# Patient Record
Sex: Female | Born: 1970 | Race: White | Hispanic: No | Marital: Single | Smoking: Never smoker
Health system: Southern US, Community
[De-identification: ages and names within clinical notes are randomized; demographics above are authoritative.]

## PROBLEM LIST (undated history)

## (undated) DIAGNOSIS — F419 Anxiety disorder, unspecified: Secondary | ICD-10-CM

## (undated) DIAGNOSIS — J302 Other seasonal allergic rhinitis: Secondary | ICD-10-CM

## (undated) DIAGNOSIS — E669 Obesity, unspecified: Secondary | ICD-10-CM

## (undated) DIAGNOSIS — N736 Female pelvic peritoneal adhesions (postinfective): Secondary | ICD-10-CM

## (undated) DIAGNOSIS — E282 Polycystic ovarian syndrome: Secondary | ICD-10-CM

## (undated) DIAGNOSIS — T4145XA Adverse effect of unspecified anesthetic, initial encounter: Secondary | ICD-10-CM

## (undated) DIAGNOSIS — F339 Major depressive disorder, recurrent, unspecified: Secondary | ICD-10-CM

## (undated) HISTORY — DX: Major depressive disorder, recurrent, unspecified: F33.9

## (undated) HISTORY — PX: CHOLECYSTECTOMY: SHX55

## (undated) HISTORY — DX: Obesity, unspecified: E66.9

## (undated) HISTORY — DX: Other seasonal allergic rhinitis: J30.2

## (undated) HISTORY — DX: Anxiety disorder, unspecified: F41.9

## (undated) HISTORY — DX: Female pelvic peritoneal adhesions (postinfective): N73.6

---

## 1989-11-26 DIAGNOSIS — T8859XA Other complications of anesthesia, initial encounter: Secondary | ICD-10-CM

## 1989-11-26 HISTORY — DX: Other complications of anesthesia, initial encounter: T88.59XA

## 1999-11-17 ENCOUNTER — Other Ambulatory Visit: Admission: RE | Admit: 1999-11-17 | Discharge: 1999-11-17 | Payer: Self-pay | Admitting: Obstetrics and Gynecology

## 2000-01-02 ENCOUNTER — Other Ambulatory Visit: Admission: RE | Admit: 2000-01-02 | Discharge: 2000-01-02 | Payer: Self-pay | Admitting: Obstetrics and Gynecology

## 2000-02-07 ENCOUNTER — Encounter (INDEPENDENT_AMBULATORY_CARE_PROVIDER_SITE_OTHER): Payer: Self-pay

## 2000-02-07 ENCOUNTER — Other Ambulatory Visit: Admission: RE | Admit: 2000-02-07 | Discharge: 2000-02-07 | Payer: Self-pay | Admitting: Obstetrics and Gynecology

## 2000-12-05 ENCOUNTER — Other Ambulatory Visit: Admission: RE | Admit: 2000-12-05 | Discharge: 2000-12-05 | Payer: Self-pay | Admitting: Obstetrics and Gynecology

## 2001-12-17 ENCOUNTER — Other Ambulatory Visit: Admission: RE | Admit: 2001-12-17 | Discharge: 2001-12-17 | Payer: Self-pay | Admitting: Obstetrics and Gynecology

## 2003-12-31 ENCOUNTER — Other Ambulatory Visit: Admission: RE | Admit: 2003-12-31 | Discharge: 2003-12-31 | Payer: Self-pay | Admitting: Obstetrics and Gynecology

## 2004-05-26 ENCOUNTER — Ambulatory Visit (HOSPITAL_BASED_OUTPATIENT_CLINIC_OR_DEPARTMENT_OTHER): Admission: RE | Admit: 2004-05-26 | Discharge: 2004-05-26 | Payer: Self-pay | Admitting: Obstetrics and Gynecology

## 2005-04-26 ENCOUNTER — Ambulatory Visit (HOSPITAL_COMMUNITY): Admission: RE | Admit: 2005-04-26 | Discharge: 2005-04-26 | Payer: Self-pay | Admitting: Surgery

## 2005-04-28 ENCOUNTER — Ambulatory Visit (HOSPITAL_COMMUNITY): Admission: RE | Admit: 2005-04-28 | Discharge: 2005-04-28 | Payer: Self-pay | Admitting: Obstetrics and Gynecology

## 2006-01-15 ENCOUNTER — Encounter: Admission: RE | Admit: 2006-01-15 | Discharge: 2006-04-15 | Payer: Self-pay | Admitting: Gynecology

## 2006-02-04 ENCOUNTER — Other Ambulatory Visit: Admission: RE | Admit: 2006-02-04 | Discharge: 2006-02-04 | Payer: Self-pay | Admitting: Gynecology

## 2006-05-28 ENCOUNTER — Encounter: Admission: RE | Admit: 2006-05-28 | Discharge: 2006-05-28 | Payer: Self-pay | Admitting: Gynecology

## 2006-08-09 ENCOUNTER — Ambulatory Visit (HOSPITAL_COMMUNITY): Admission: RE | Admit: 2006-08-09 | Discharge: 2006-08-09 | Payer: Self-pay | Admitting: Gynecology

## 2006-08-20 ENCOUNTER — Inpatient Hospital Stay (HOSPITAL_COMMUNITY): Admission: RE | Admit: 2006-08-20 | Discharge: 2006-08-23 | Payer: Self-pay | Admitting: Gynecology

## 2006-08-20 ENCOUNTER — Encounter (INDEPENDENT_AMBULATORY_CARE_PROVIDER_SITE_OTHER): Payer: Self-pay | Admitting: Specialist

## 2006-10-04 ENCOUNTER — Other Ambulatory Visit: Admission: RE | Admit: 2006-10-04 | Discharge: 2006-10-04 | Payer: Self-pay | Admitting: Gynecology

## 2007-10-06 ENCOUNTER — Other Ambulatory Visit: Admission: RE | Admit: 2007-10-06 | Discharge: 2007-10-06 | Payer: Self-pay | Admitting: Obstetrics and Gynecology

## 2010-12-17 ENCOUNTER — Encounter: Payer: Self-pay | Admitting: Obstetrics and Gynecology

## 2011-04-13 NOTE — H&P (Signed)
NAMEBurnadette Hoffman, Christina Hoffman                   ACCOUNT NO.:  [id number]   MEDICAL RECORD NO.:  [id number]          PATIENT TYPE:  INP   LOCATION:  9147                          FACILITY:  [facility]   PHYSICIAN:  Timothy P. 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   HISTORY OF PRESENT ILLNESS:  A 40 year old, G1 P0, female, EDC 08/29/2006,  admitted for cesarean section due to breech presentation.  Pregnancy has  been complicated by gestational diabetes followed with diet-controlled  antepartum testing which has been normal.  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  For the remainder of her history, see her [facility].   PHYSICAL EXAMINATION:  HEENT:  Normal.  LUNGS:  Clear.  CARDIAC:  Regular rate.  No rubs, murmurs or gallops.  ABDOMINAL EXAM:  Gravid breech presentation, positive fetal heart tones.  PELVIC:  Deferred.   ASSESSMENT AND PLAN:  A 40 year old, G1 P0 female, term gestation, history  of breech presentation, gestational diabetes, positive beta streptococcus  carrier, for primary cesarean section.  Options for attempted version,  vaginal delivery breech versus primary cesarean section were all reviewed,  and the patient elects for a primary cesarean section.  I reviewed with what  is involved with the procedure, expected intraoperative and postoperative  course, short-term and long-term risks to include the risk of infection,  prolonged antibiotics, wound complications requiring opening and draining of  incisions, closure by secondary intention, abscess formation with  reoperation, abscess drainage, hemorrhage necessitating transfusion and the  risks of transfusion, inadvertent injury to internal organs including bowel,  bladder, ureters, vessels and nerves  necessitating major exploratory  reparative surgeries and future reparative surgeries including ostomy  formation, the risk of fetal injury during the birthing process to include  musculoskeletal, neural and skeletal injuries were all reviewed, understood  and accepted.  The patient's questions were answered to her satisfaction,  and she is ready to proceed with surgery.      Timothy P. Fontaine, M.D.  Electronically Signed     TPF/MEDQ  D:  08/20/2006  T:  08/20/2006  Job:  [id number]

## 2011-04-13 NOTE — Op Note (Signed)
NAMEBurnadette Hoffman, Christina Hoffman                   ACCOUNT NO.:  [id number]   MEDICAL RECORD NO.:  [id number]          PATIENT TYPE:  INP   LOCATION:  9147                          FACILITY:  [facility]   PHYSICIAN:  Timothy P. 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   PROCEDURE:  Primary low transverse cervical cesarean section.   SURGEON:  Timothy P. Fontaine, M.D.   ASSISTANT:  Ivor Costa. Farrel Gobble, M.D.   ANESTHETIC:  Spinal.   ESTIMATED BLOOD LOSS:  Less than 500 mL.   COMPLICATIONS:  None.   SPECIMEN:  1. Placenta.  2. Cord blood  3. Cord blood banking, private.   FINDINGS:  At 1339 normal female, Apgars 9 and 9, weight 6 pounds 13 ounces.  Pelvic anatomy noted to be normal.  Infant in the frank breech presentation.   PROCEDURE:  The patient was taken to the operating room, underwent spinal  anesthesia, received an abdominal preparation with Betadine solution.  Foley  catheter placed in sterile technique per nursing personnel.  The patient was  draped in the usual fashion.  After assuring adequate anesthesia, the  abdomen sharply entered through a Pfannenstiel's incision achieving adequate  hemostasis at all levels.  The bladder flap was sharply bluntly developed  without difficulty and the uterus was sharply entered in the lower uterine  segment and bluntly extended laterally.  The membranes were ruptured.  The  fluid noted to be clear.  The infant found in the frank breech presentation  was converted to a footling breech and underwent a breech extraction without  difficulty.  The nares and mouth were suctioned.  The cord doubly clamped  and cut and the  infant was handed to pediatrics in attendance.  Samples of  cord blood were obtained.  Subsequently, private cord blood banking was  obtained and the specimen was handed to the circulating nurse for  processing.  The placenta was then spontaneously extruded and noted to be  intact and was sent to pathology.  The patient received 1 gram of antibiotic  prophylaxis at this time.  The uterine cavity was explored with a sponge to  remove all placental and membrane fragments.  The uterine incision was then  closed in two layers using zero Vicryl suture first in a running  interlocking stitch followed by imbricating stitch.  The adnexa were then  examined with grossly normal-appearing fallopian tubes and ovaries  bilaterally.  The pelvis was copiously irrigated.  Adequate hemostasis  visualized.  The anterior fascia reapproximated using zero Vicryl suture in  a running stitch starting at the angle, meeting in the middle.  Subcutaneous  tissues were irrigated.  Adequate hemostasis achieved with electrocautery.  Skin reapproximated using 4-0 Vicryl in a running subcuticular stitch.  Steri-Strips and Benzoin applied.  Sterile dressing applied.  The patient  was taken to the recovery room in good condition having tolerated the  procedure well.      Timothy P. Fontaine, M.D.  Electronically Signed     TPF/MEDQ  D:  08/20/2006  T:  08/22/2006  Job:  [id number]

## 2011-04-13 NOTE — Discharge Summary (Signed)
NAMEBurnadette Hoffman, [name]                   ACCOUNT NO.:  [id number]   MEDICAL RECORD NO.:  [id number]          PATIENT TYPE:  INP   LOCATION:  9147                          FACILITY:  [facility]   PHYSICIAN:  Timothy P. Fontaine, M.D.DATE OF BIRTH:  1971/03/09   DATE OF ADMISSION:  08/20/2006  DATE OF DISCHARGE:  08/23/2006                                 DISCHARGE SUMMARY   DISCHARGE DIAGNOSES:  1. Term pregnancy.  2. Gestational diabetes.  3. Positive beta strep carrier.  4. Breech presentation.   POSTOPERATIVE DIAGNOSES:  1. Term pregnancy.  2. Gestational diabetes.  3. Positive beta strep carrier.  4. Breech presentation.   PROCEDURE:  Primary low transverse cervical cesarean section, August 20, 2006, Dr. Colin Broach.  Pathology pending.   HOSPITAL COURSE:  A 40 year old, G1, P0, female, term gestation, who  underwent primary low transverse cervical cesarean section on August 20, 2006 for breech presentation.  The patient delivered a normal female infant,  Apgars 9/9, weight 6 pounds, 13 ounces, in the frank breech presentation.  The patient's postoperative course was uncomplicated, and she was discharged  on postoperative day #3, ambulating well, following a regular diet, with a  postoperative hemoglobin of 10.9.  Blood type O positive, rubella titer  positive.  The patient received precautions, instructions and followup.   FOLLOWUP:  Will be seen in the office 6 weeks following discharge.   MEDICATIONS:  Was given a prescription for pain medication per Dr. Douglass Rivers.      Timothy P. Fontaine, M.D.  Electronically Signed     TPF/MEDQ  D:  08/23/2006  T:  08/24/2006  Job:  [id number]

## 2011-04-13 NOTE — Op Note (Signed)
NAME:  [facility], [place]                               ACCOUNT NO.:  [id number]   MEDICAL RECORD NO.:  [id number]                   PATIENT TYPE:  AMB   LOCATION:  [facility]                                 FACILITY:  [facility]   PHYSICIAN:  Daniel L. Eda Paschal, M.D.           DATE OF BIRTH:  14-Jun-1971   DATE OF PROCEDURE:  05/26/2004  DATE OF DISCHARGE:                                 OPERATIVE REPORT   PREOPERATIVE DIAGNOSES:  1. Polycystic ovarian syndrome.  2. Endometriosis suspected.   POSTOPERATIVE DIAGNOSES:  1. Polycystic ovarian syndrome.  2. Enlarged uterus, probably adenomyosis.   OPERATION/PROCEDURE:  Diagnostic laparoscopy.   SURGEON:  Daniel L. Eda Paschal, M.D.   ANESTHESIA:  General.   DESCRIPTION OF PROCEDURE:  After adequate general anesthesia, the patient  was placed in the dorsal lithotomy position and prepped and draped in the  usual sterile manner.  Her bladder was emptied with the [facility] catheter.  A Jarcho cannula was placed in the uterus.  A small incision was made  subumbilically and then through this right Veress needle was introduced.  Because of the patient's large size, a long Veress needle was utilized.  When it went in, it felt as if there had been the normal pop through both  fascia and peritoneum.  When fluid was put in, it appeared to go down  without coming back and it appeared to be well placed.  Initially about 1 L  of CO2 could be introduced, but at that point, no more could be introduced.  Attempts were made to replace the needle but we could not longer get any  flow.  It was felt at this point we should abandon a closed laparoscopy  technique and go to an open.   The subumbilical incision was extended.  After much difficulty, the fascia  was identified.  Because the patient was so obese, even at this point it was  really exceedingly difficult to open the peritoneum, and, therefore, at this  point under direct vision, a Veress needle was introduced  through the  peritoneum.  Once again when saline was placed, it disappeared without any  evidence of returning.  But once again flow was as minimal.  Elevating this  area and being exceedingly gentle, a 10 mm trocar was placed.  We had to use  a long one because even though we could see all the way to the peritoneum,  the depth of her fascia and preperitoneal area was significant and this was  gently introduced until we felt a pop into the peritoneal cavity.  The  diagnostic laparoscope was introduced.  We were clearing the peritoneal  cavity and there was no evidence of injury to any structures, but even now,  the fascial sutures had been placed in place to prevent loss of  pneumoperitoneum, we could never develop a significant pneumoperitoneum.  However, we could see well enough to do  a diagnostic laparoscopy.  We could  see anteriorly below the subumbilical incision and a 5 mm port could be  successfully placed there without any injury. Through that a probe was  placed.  By elevating the uterus, putting tension on the uterus, with the  probe we were able to look around. The patient had normal fallopian tubes.  The patient had very large polycystic ovaries with clean surfaces and no  evidence of any neoplasm.  Cul-de-sac was free of any disease.  The uterus  itself, however, was very hard to move, was very boggy and enlarged.  No  specific myoma could be identified and the surgeon was wondering if she did  not have adenomyosis.  This will be compared to preoperative ultrasound that  were done during previous ovulation and duction cycles.   At the termination of the procedure, the trocars were removed.  The fascia  could be reapproximated under direct vision with interrupted 0 Vicryl and  the skin was closed with a 3-0 Monocryl subumbilically and then Dermabond  was placed on the lower 5 mm incision.  The patient tolerated the procedure  well and left the operating room in satisfactory  condition.                                               Daniel L. Eda Paschal, M.D.    Tonette Bihari  D:  05/26/2004  T:  05/26/2004  Job:  [id number]

## 2013-08-27 ENCOUNTER — Emergency Department (HOSPITAL_COMMUNITY): Payer: Medicaid Other

## 2013-08-27 ENCOUNTER — Emergency Department (HOSPITAL_COMMUNITY)
Admission: EM | Admit: 2013-08-27 | Discharge: 2013-08-27 | Disposition: A | Discharge status: Home / Self Care | Payer: Medicaid Other | Attending: Emergency Medicine | Admitting: Emergency Medicine

## 2013-08-27 ENCOUNTER — Encounter (HOSPITAL_COMMUNITY): Payer: Self-pay

## 2013-08-27 DIAGNOSIS — E282 Polycystic ovarian syndrome: Secondary | ICD-10-CM | POA: Insufficient documentation

## 2013-08-27 DIAGNOSIS — Z79899 Other long term (current) drug therapy: Secondary | ICD-10-CM | POA: Insufficient documentation

## 2013-08-27 DIAGNOSIS — R630 Anorexia: Secondary | ICD-10-CM | POA: Insufficient documentation

## 2013-08-27 DIAGNOSIS — R102 Pelvic and perineal pain: Secondary | ICD-10-CM

## 2013-08-27 DIAGNOSIS — N949 Unspecified condition associated with female genital organs and menstrual cycle: Secondary | ICD-10-CM | POA: Insufficient documentation

## 2013-08-27 HISTORY — DX: Polycystic ovarian syndrome: E28.2

## 2013-08-27 LAB — CBC WITH DIFFERENTIAL/PLATELET
Eosinophils Relative: 1 % (ref 0–5)
HCT: 40.1 % (ref 36.0–46.0)
Hemoglobin: 13.8 g/dL (ref 12.0–15.0)
Lymphocytes Relative: 28 % (ref 12–46)
MCH: 30.5 pg (ref 26.0–34.0)
MCV: 88.5 fL (ref 78.0–100.0)
Monocytes Absolute: 0.7 10*3/uL (ref 0.1–1.0)
Neutrophils Relative %: 67 % (ref 43–77)
Platelets: 279 10*3/uL (ref 150–400)
RBC: 4.53 MIL/uL (ref 3.87–5.11)
RDW: 13.5 % (ref 11.5–15.5)
WBC: 15.1 10*3/uL — ABNORMAL HIGH (ref 4.0–10.5)

## 2013-08-27 LAB — BASIC METABOLIC PANEL
BUN: 10 mg/dL (ref 6–23)
Calcium: 9.1 mg/dL (ref 8.4–10.5)
GFR calc Af Amer: >90 mL/min (ref >90)
GFR calc non Af Amer: >90 mL/min (ref >90)
Glucose, Bld: 94 mg/dL (ref 70–99)
Sodium: 140 mEq/L (ref 135–145)

## 2013-08-27 LAB — URINALYSIS, ROUTINE W REFLEX MICROSCOPIC
Bilirubin Urine: NEGATIVE
Glucose, UA: NEGATIVE mg/dL
Ketones, ur: 15 mg/dL — AB
Urobilinogen, UA: 0.2 mg/dL (ref 0.0–1.0)

## 2013-08-27 MED ORDER — IOHEXOL 300 MG/ML  SOLN
25.0000 mL | INTRAMUSCULAR | Status: AC
  Administered 2013-08-27: 25 mL via ORAL

## 2013-08-27 MED ORDER — TRAMADOL HCL 50 MG PO TABS: 50.0000 mg | ORAL_TABLET | Freq: Four times a day (QID) | ORAL | Status: DC | PRN

## 2013-08-27 MED ORDER — IOHEXOL 300 MG/ML  SOLN
100.0000 mL | Freq: Once | INTRAMUSCULAR | Status: AC | PRN
  Administered 2013-08-27: 100 mL via INTRAVENOUS

## 2013-08-27 MED ORDER — NAPROXEN 500 MG PO TABS: 500.0000 mg | ORAL_TABLET | Freq: Two times a day (BID) | ORAL | Status: DC

## 2013-08-27 MED ORDER — KETOROLAC TROMETHAMINE 30 MG/ML IJ SOLN
30.0000 mg | Freq: Once | INTRAMUSCULAR | Status: AC
  Administered 2013-08-27: 30 mg via INTRAVENOUS
  Filled 2013-08-27: qty 1

## 2013-08-27 NOTE — ED Notes (Signed)
Patient transported to CT 

## 2013-08-27 NOTE — ED Notes (Signed)
Pt made aware of delay for CT.  No other concerns or questions at this time

## 2013-08-27 NOTE — ED Notes (Signed)
Presents with RLQ abdominal pain that began one year ago, became worse last night after eating large meal. Pain associated with nausea and is constant today. Denies vaginal bleeding or discharge.

## 2013-08-27 NOTE — ED Notes (Signed)
Patient is resting comfortably.  Patient given [place] sandwich and coke.  Requested Tylenol for headache.  Tuskegee Cellar RN notified.

## 2013-08-27 NOTE — ED Notes (Signed)
Dr. James at bedside  

## 2013-08-28 LAB — URINE CULTURE
Colony Count: NO GROWTH
Culture: NO GROWTH

## 2013-09-03 NOTE — ED Provider Notes (Signed)
CSN: [id number]     Arrival date & time 08/27/13  1608 History   First MD Initiated Contact with Patient 08/27/13 1618     Chief Complaint  Patient presents with  . Abdominal Pain   ) HPI  Patient has had abdominal pain for over a years time. Has been intermittent but primarily in her right lower quadrant. Most times it starts about 5 or 6 days "before.". She is about a week premenstrual now. No bleeding between cycles. No dyspareunia. No vaginal bleeding discharge or itching no urinary burning or frequency. Globally poor appetite but no nausea vomiting or diarrhea.  Past Medical History  Diagnosis Date  . Polycystic ovarian syndrome    Past Surgical History  Procedure Laterality Date  . Cesarean section    . Cholecystectomy  23 years ago    had bile in abdomen after surgery   No family history on file. History  Substance Use Topics  . Smoking status: Never Smoker   . Smokeless tobacco: Not on file  . Alcohol Use: No   OB History   Grav Para Term Preterm Abortions TAB SAB Ect Mult Living                 Review of Systems  Constitutional: Negative for fever, chills, diaphoresis, appetite change and fatigue.  HENT: Negative for mouth sores, sore throat and trouble swallowing.   Eyes: Negative for visual disturbance.  Respiratory: Negative for cough, chest tightness, shortness of breath and wheezing.   Cardiovascular: Negative for chest pain.  Gastrointestinal: Positive for abdominal pain. Negative for nausea, vomiting, diarrhea and abdominal distention.  Endocrine: Negative for polydipsia, polyphagia and polyuria.  Genitourinary: Positive for pelvic pain. Negative for dysuria, frequency and hematuria.  Musculoskeletal: Negative for gait problem.  Skin: Negative for color change, pallor and rash.  Neurological: Negative for dizziness, syncope, light-headedness and headaches.  Hematological: Does not bruise/bleed easily.  Psychiatric/Behavioral: Negative for behavioral  problems and confusion.    Allergies  Review of patient's allergies indicates no known allergies.  Home Medications   Current Outpatient Rx  Name  Route  Sig  Dispense  Refill  . ibuprofen (ADVIL,MOTRIN) 200 MG tablet   Oral   Take 400 mg by mouth every 6 (six) hours as needed for pain.         Marland Kitchen metformin (FORTAMET) 500 MG (OSM) 24 hr tablet   Oral   Take 250 mg by mouth daily.         . naproxen (NAPROSYN) 375 MG tablet   Oral   Take 375 mg by mouth 2 (two) times daily as needed (pain).         . Norgestimate-Ethinyl Estradiol Triphasic (ORTHO TRI-CYCLEN LO) 0.18/0.215/0.25 MG-25 MCG tab   Oral   Take 1 tablet by mouth daily.         . traMADol (ULTRAM) 50 MG tablet   Oral   Take 50 mg by mouth every 6 (six) hours as needed for pain.         . naproxen (NAPROSYN) 500 MG tablet   Oral   Take 1 tablet (500 mg total) by mouth 2 (two) times daily.   30 tablet   0   . traMADol (ULTRAM) 50 MG tablet   Oral   Take 1 tablet (50 mg total) by mouth every 6 (six) hours as needed for pain.   15 tablet   0    BP 133/84  Pulse 88  Temp(Src) 98.2 F (  36.8 C) (Oral)  Resp 18  Ht 5\' 3"  (1.6 m)  Wt 209 lb (94.802 kg)  BMI 37.03 kg/m2  SpO2 99%  LMP 07/27/2013 Physical Exam  Constitutional: She is oriented to person, place, and time. She appears well-developed and well-nourished. No distress.  HENT:  Head: Normocephalic.  Eyes: Conjunctivae are normal. Pupils are equal, round, and reactive to light. No scleral icterus.  Neck: Normal range of motion. Neck supple. No thyromegaly present.  Cardiovascular: Normal rate and regular rhythm.  Exam reveals no gallop and no friction rub.   No murmur heard. Pulmonary/Chest: Effort normal and breath sounds normal. No respiratory distress. She has no wheezes. She has no rales.  Abdominal: Soft. Bowel sounds are normal. She exhibits no distension. There is no tenderness. There is no rebound.  Tenderness poorly localized to  the right lower quadrant. No guarding rebound or peritoneal irritation. Some suprapubic discomfort as well again no guarding rebound or peritoneal irritation  Musculoskeletal: Normal range of motion.  Neurological: She is alert and oriented to person, place, and time.  Skin: Skin is warm and dry. No rash noted.  Psychiatric: She has a normal mood and affect. Her behavior is normal.    ED Course  Procedures (including critical care time) Labs Review Labs Reviewed  CBC WITH DIFFERENTIAL - Abnormal; Notable for the following:    WBC 15.1 (*)    Neutro Abs 10.1 (*)    Lymphs Abs 4.2 (*)    All other components within normal limits  URINALYSIS, ROUTINE W REFLEX MICROSCOPIC - Abnormal; Notable for the following:    APPearance HAZY (*)    Ketones, ur 15 (*)    All other components within normal limits  URINE CULTURE  BASIC METABOLIC PANEL   Imaging Review No results found.  MDM   1. Pelvic pain    CT and pelvic ultrasound showed no acute findings. Discussed with her this may be simply premenstrual discomfort. Her for her GYN. Recheck primary as well. No acute findings noted today.    Roney Marion, MD 09/03/13 [phone]

## 2013-10-02 ENCOUNTER — Ambulatory Visit (INDEPENDENT_AMBULATORY_CARE_PROVIDER_SITE_OTHER): Payer: Medicaid Other | Admitting: Family Medicine

## 2013-10-02 ENCOUNTER — Encounter: Payer: Self-pay | Admitting: Family Medicine

## 2013-10-02 VITALS — BP 136/82 | HR 85 | Temp 98.9°F | Wt 209.0 lb

## 2013-10-02 DIAGNOSIS — N83209 Unspecified ovarian cyst, unspecified side: Secondary | ICD-10-CM

## 2013-10-02 MED ORDER — NORGESTIM-ETH ESTRAD TRIPHASIC 0.18/0.215/0.25 MG-25 MCG PO TABS: 1.0000 | ORAL_TABLET | Freq: Every day | ORAL | Status: DC
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
## 2013-10-02 NOTE — Progress Notes (Signed)
Pt. States she was having severe abdominal pain 08/27/13. Ruled out appendacitis and sent her here for an exam. Pt. States she has PCOS and that ED doctor thinks she may have had a cyst rupture and wants a f/u ultrasound. Pt. States she is back on her birth control pills and pain medication and pain has subsided.

## 2013-10-16 ENCOUNTER — Ambulatory Visit (HOSPITAL_COMMUNITY)
Admission: RE | Admit: 2013-10-16 | Discharge: 2013-10-16 | Disposition: A | Discharge status: Home / Self Care | Payer: Medicaid Other | Source: Ambulatory Visit | Attending: Family Medicine | Admitting: Family Medicine

## 2013-10-16 DIAGNOSIS — N83209 Unspecified ovarian cyst, unspecified side: Secondary | ICD-10-CM | POA: Diagnosis present

## 2013-10-19 ENCOUNTER — Encounter: Payer: Self-pay | Admitting: Family Medicine

## 2013-10-19 ENCOUNTER — Telehealth: Payer: Self-pay | Admitting: General Practice

## 2013-10-19 NOTE — Telephone Encounter (Signed)
Called patient and informed her of results. Patient was satisfied, verbalized understanding and had no further questions

## 2013-10-19 NOTE — Telephone Encounter (Signed)
Message copied by Kathee Delton on Mon Oct 19, 2013  2:47 PM ------      Message from: Jolyn Lent R      Created: Mon Oct 19, 2013 12:21 PM       Please let pt know cyst resolved.            IMPRESSION:      Apparent interval resolution of previously seen left ovarian      presumed physiologic cyst.            Tawana Scale, MD      OB Fellow       ------

## 2014-02-23 DIAGNOSIS — Z0289 Encounter for other administrative examinations: Secondary | ICD-10-CM

## 2014-05-05 ENCOUNTER — Ambulatory Visit: Payer: Medicaid Other | Attending: Urology | Admitting: Physical Therapy

## 2014-05-05 ENCOUNTER — Ambulatory Visit: Payer: Medicaid Other | Admitting: Physical Therapy

## 2014-05-19 ENCOUNTER — Encounter (HOSPITAL_COMMUNITY): Payer: Self-pay | Admitting: *Deleted

## 2014-05-19 ENCOUNTER — Encounter (HOSPITAL_COMMUNITY): Payer: Self-pay | Admitting: Pharmacist

## 2014-05-26 NOTE — H&P (Signed)
Christina Hoffman is an 43 y.o. female, P1001 (possibly with other "chemical pregnancies"), referred for pelvic pain. She is having monthly menses with OCP, no problems with menses. She is also on Metformin for a h/o PCOS. She is fairly sure she had a normal Pap August 2014 at the [facility]. She is having random pelvic pain on the right side for the past few years, now getting worse. Pain in general happens for one day 1-2x/month. Pain is sharp and in right pelvis, completely random, no specific aggravating or relieving factors, no associated with menses or any other time in cycle. She has had current pain for the past 2 weeks, it is making it difficult for her to even walk. She reports she had 2 pelvic ultrasounds late last year for pain, cyst was found on left ovary which was not tender and resolved. The last 2 times she has had this pain, her urine has turned an orange color.Recent urological evaluation was not helpful.  Urine specimens in the office when she says urine is especially cloudy are contaminated with epithelial cells.    Pertinent Gynecological History: Last pap: normal per patient,  Date: 2014 OB History: G1, P1001   Menstrual History: No LMP recorded.    Past Medical History  Diagnosis Date  . Polycystic ovarian syndrome   . Complication of anesthesia 1991    woke up during procedure  Hypercholesterolemia, Low Vitamin D Depression Obesity Glucose intolerance  Past Surgical History  Procedure Laterality Date  . Cesarean section    . Cholecystectomy  23 years ago    had bile in abdomen after surgery  Laparoscopy in 2005 for pain, prior to c-section  No family history on file.  Social History:  reports that she has never smoked. She has never used smokeless tobacco. She reports that she does not drink alcohol or use illicit drugs.  Allergies: No Known Allergies  No prescriptions prior to admission    Review of Systems  Respiratory: Negative.   Cardiovascular:  Negative.   Gastrointestinal: Negative.   Genitourinary: Negative.     There were no vitals taken for this visit. Physical Exam  Constitutional: She appears well-developed and well-nourished.  Cardiovascular: Normal rate, regular rhythm and normal heart sounds.   No murmur heard. Respiratory: Effort normal and breath sounds normal. No respiratory distress. She has no wheezes.  GI: Soft. She exhibits no distension and no mass. Tenderness: mild tenderness RLQ.  Transverse scar  Genitourinary: Vagina normal.  Uterus normal size, non-tender No adnexal mass, tender on right    No results found for this or any previous visit (from the past 24 hour(s)).  No results found.  Assessment/Plan: Chronic pelvic pain of uncertain etiology, possibility of endometriosis or adhesions.  Medical and surgical options have been discussed.  Surgical procedure and risks discussed, as well as the fact that I may not find a source for her pain so her pain may not be improved from this procedure.  Will proceed with open diagnostic/possible operative laparoscopy.  Christina Hoffman D 05/26/2014, 5:59 PM

## 2014-05-27 ENCOUNTER — Ambulatory Visit (HOSPITAL_COMMUNITY)
Admission: RE | Admit: 2014-05-27 | Discharge: 2014-05-27 | Disposition: A | Discharge status: Home / Self Care | Payer: Medicaid Other | Source: Ambulatory Visit | Attending: Obstetrics and Gynecology | Admitting: Obstetrics and Gynecology

## 2014-05-27 ENCOUNTER — Encounter (HOSPITAL_COMMUNITY)
Admission: RE | Disposition: A | Discharge status: Home / Self Care | Payer: Self-pay | Source: Ambulatory Visit | Attending: Obstetrics and Gynecology

## 2014-05-27 ENCOUNTER — Encounter (HOSPITAL_COMMUNITY): Payer: Self-pay | Admitting: *Deleted

## 2014-05-27 ENCOUNTER — Encounter (HOSPITAL_COMMUNITY): Payer: Medicaid Other | Admitting: Anesthesiology

## 2014-05-27 ENCOUNTER — Ambulatory Visit (HOSPITAL_COMMUNITY): Payer: Medicaid Other | Admitting: Anesthesiology

## 2014-05-27 DIAGNOSIS — N949 Unspecified condition associated with female genital organs and menstrual cycle: Secondary | ICD-10-CM | POA: Insufficient documentation

## 2014-05-27 DIAGNOSIS — G8929 Other chronic pain: Secondary | ICD-10-CM | POA: Insufficient documentation

## 2014-05-27 DIAGNOSIS — F329 Major depressive disorder, single episode, unspecified: Secondary | ICD-10-CM | POA: Insufficient documentation

## 2014-05-27 DIAGNOSIS — E282 Polycystic ovarian syndrome: Secondary | ICD-10-CM | POA: Insufficient documentation

## 2014-05-27 DIAGNOSIS — F3289 Other specified depressive episodes: Secondary | ICD-10-CM | POA: Insufficient documentation

## 2014-05-27 DIAGNOSIS — R102 Pelvic and perineal pain unspecified side: Secondary | ICD-10-CM | POA: Diagnosis present

## 2014-05-27 DIAGNOSIS — N736 Female pelvic peritoneal adhesions (postinfective): Secondary | ICD-10-CM

## 2014-05-27 DIAGNOSIS — E739 Lactose intolerance, unspecified: Secondary | ICD-10-CM | POA: Insufficient documentation

## 2014-05-27 DIAGNOSIS — E7800 Pure hypercholesterolemia, unspecified: Secondary | ICD-10-CM | POA: Insufficient documentation

## 2014-05-27 HISTORY — DX: Female pelvic peritoneal adhesions (postinfective): N73.6

## 2014-05-27 HISTORY — PX: LAPAROSCOPY: SHX197

## 2014-05-27 HISTORY — DX: Adverse effect of unspecified anesthetic, initial encounter: T41.45XA

## 2014-05-27 LAB — CBC
HEMATOCRIT: 36.8 % (ref 36.0–46.0)
Hemoglobin: 11.9 g/dL — ABNORMAL LOW (ref 12.0–15.0)
MCH: 29.5 pg (ref 26.0–34.0)
MCHC: 32.3 g/dL (ref 30.0–36.0)
MCV: 91.1 fL (ref 78.0–100.0)
PLATELETS: 303 10*3/uL (ref 150–400)
RBC: 4.04 MIL/uL (ref 3.87–5.11)
RDW: 13.5 % (ref 11.5–15.5)
WBC: 12.9 10*3/uL — AB (ref 4.0–10.5)

## 2014-05-27 LAB — COMPREHENSIVE METABOLIC PANEL
ALBUMIN: 3.2 g/dL — AB (ref 3.5–5.2)
ALK PHOS: 50 U/L (ref 39–117)
ALT: 9 U/L (ref 0–35)
AST: 12 U/L (ref 0–37)
Anion gap: 14 (ref 5–15)
BUN: 12 mg/dL (ref 6–23)
CHLORIDE: 105 meq/L (ref 96–112)
CO2: 23 meq/L (ref 19–32)
Calcium: 9.3 mg/dL (ref 8.4–10.5)
Creatinine, Ser: 0.79 mg/dL (ref 0.50–1.10)
GFR calc Af Amer: >90 mL/min (ref >90)
GFR calc non Af Amer: >90 mL/min (ref >90)
Glucose, Bld: 103 mg/dL — ABNORMAL HIGH (ref 70–99)
POTASSIUM: 4.4 meq/L (ref 3.7–5.3)
Sodium: 142 mEq/L (ref 137–147)
Total Protein: 6.2 g/dL (ref 6.0–8.3)

## 2014-05-27 LAB — PREGNANCY, URINE: Preg Test, Ur: NEGATIVE

## 2014-05-27 SURGERY — LAPAROSCOPY, DIAGNOSTIC
Anesthesia: General | Site: Abdomen

## 2014-05-27 MED ORDER — PROPOFOL 10 MG/ML IV BOLUS
INTRAVENOUS | Status: DC | PRN
  Administered 2014-05-27: 200 mg via INTRAVENOUS

## 2014-05-27 MED ORDER — LIDOCAINE HCL (CARDIAC) 20 MG/ML IV SOLN
INTRAVENOUS | Status: DC | PRN
  Administered 2014-05-27: 50 mg via INTRAVENOUS

## 2014-05-27 MED ORDER — KETOROLAC TROMETHAMINE 30 MG/ML IJ SOLN: 15.0000 mg | Freq: Once | INTRAMUSCULAR | Status: DC | PRN

## 2014-05-27 MED ORDER — MEPERIDINE HCL 25 MG/ML IJ SOLN: 6.2500 mg | INTRAMUSCULAR | Status: DC | PRN

## 2014-05-27 MED ORDER — FENTANYL CITRATE 0.05 MG/ML IJ SOLN
INTRAMUSCULAR | Status: AC
  Filled 2014-05-27: qty 5

## 2014-05-27 MED ORDER — DEXAMETHASONE SODIUM PHOSPHATE 10 MG/ML IJ SOLN
INTRAMUSCULAR | Status: DC | PRN
  Administered 2014-05-27: 6 mg via INTRAVENOUS

## 2014-05-27 MED ORDER — KETOROLAC TROMETHAMINE 30 MG/ML IJ SOLN
INTRAMUSCULAR | Status: AC
  Filled 2014-05-27: qty 1

## 2014-05-27 MED ORDER — FENTANYL CITRATE 0.05 MG/ML IJ SOLN
INTRAMUSCULAR | Status: DC | PRN
  Administered 2014-05-27: 100 ug via INTRAVENOUS
  Administered 2014-05-27: 50 ug via INTRAVENOUS
  Administered 2014-05-27: 100 ug via INTRAVENOUS

## 2014-05-27 MED ORDER — PROPOFOL 10 MG/ML IV EMUL
INTRAVENOUS | Status: AC
  Filled 2014-05-27: qty 20

## 2014-05-27 MED ORDER — LACTATED RINGERS IV SOLN
INTRAVENOUS | Status: DC
  Administered 2014-05-27 (×2): via INTRAVENOUS

## 2014-05-27 MED ORDER — PHENYLEPHRINE HCL 10 MG/ML IJ SOLN
INTRAMUSCULAR | Status: DC | PRN
  Administered 2014-05-27: 80 ug via INTRAVENOUS
  Administered 2014-05-27: 40 ug via INTRAVENOUS
  Administered 2014-05-27: 80 ug via INTRAVENOUS

## 2014-05-27 MED ORDER — ROCURONIUM BROMIDE 100 MG/10ML IV SOLN
INTRAVENOUS | Status: AC
  Filled 2014-05-27: qty 1

## 2014-05-27 MED ORDER — LIDOCAINE HCL (CARDIAC) 20 MG/ML IV SOLN
INTRAVENOUS | Status: AC
  Filled 2014-05-27: qty 5

## 2014-05-27 MED ORDER — HEPARIN SODIUM (PORCINE) 5000 UNIT/ML IJ SOLN
INTRAMUSCULAR | Status: AC
  Filled 2014-05-27: qty 1

## 2014-05-27 MED ORDER — MIDAZOLAM HCL 2 MG/2ML IJ SOLN
INTRAMUSCULAR | Status: AC
  Filled 2014-05-27: qty 2

## 2014-05-27 MED ORDER — MIDAZOLAM HCL 2 MG/2ML IJ SOLN
INTRAMUSCULAR | Status: DC | PRN
  Administered 2014-05-27: 2 mg via INTRAVENOUS

## 2014-05-27 MED ORDER — PROMETHAZINE HCL 25 MG/ML IJ SOLN: 6.2500 mg | INTRAMUSCULAR | Status: DC | PRN

## 2014-05-27 MED ORDER — ONDANSETRON HCL 4 MG/2ML IJ SOLN
INTRAMUSCULAR | Status: AC
  Filled 2014-05-27: qty 2

## 2014-05-27 MED ORDER — NEOSTIGMINE METHYLSULFATE 10 MG/10ML IV SOLN
INTRAVENOUS | Status: DC | PRN
  Administered 2014-05-27: 3 mg via INTRAVENOUS

## 2014-05-27 MED ORDER — GLYCOPYRROLATE 0.2 MG/ML IJ SOLN
INTRAMUSCULAR | Status: DC | PRN
  Administered 2014-05-27: 0.4 mg via INTRAVENOUS

## 2014-05-27 MED ORDER — NEOSTIGMINE METHYLSULFATE 10 MG/10ML IV SOLN
INTRAVENOUS | Status: AC
  Filled 2014-05-27: qty 1

## 2014-05-27 MED ORDER — BUPIVACAINE HCL (PF) 0.25 % IJ SOLN
INTRAMUSCULAR | Status: AC
  Filled 2014-05-27: qty 30

## 2014-05-27 MED ORDER — FENTANYL CITRATE 0.05 MG/ML IJ SOLN: 25.0000 ug | INTRAMUSCULAR | Status: DC | PRN

## 2014-05-27 MED ORDER — KETOROLAC TROMETHAMINE 30 MG/ML IJ SOLN
INTRAMUSCULAR | Status: DC | PRN
  Administered 2014-05-27: 30 mg via INTRAVENOUS

## 2014-05-27 MED ORDER — MIDAZOLAM HCL 2 MG/2ML IJ SOLN: 0.5000 mg | Freq: Once | INTRAMUSCULAR | Status: DC | PRN

## 2014-05-27 MED ORDER — DEXAMETHASONE SODIUM PHOSPHATE 10 MG/ML IJ SOLN
INTRAMUSCULAR | Status: AC
  Filled 2014-05-27: qty 1

## 2014-05-27 MED ORDER — BUPIVACAINE HCL (PF) 0.25 % IJ SOLN
INTRAMUSCULAR | Status: DC | PRN
  Administered 2014-05-27: 15 mL

## 2014-05-27 MED ORDER — ONDANSETRON HCL 4 MG/2ML IJ SOLN
INTRAMUSCULAR | Status: DC | PRN
  Administered 2014-05-27: 4 mg via INTRAVENOUS

## 2014-05-27 MED ORDER — OXYCODONE-ACETAMINOPHEN 5-325 MG PO TABS: 1.0000 | ORAL_TABLET | ORAL | Status: DC | PRN

## 2014-05-27 MED ORDER — GLYCOPYRROLATE 0.2 MG/ML IJ SOLN
INTRAMUSCULAR | Status: AC
  Filled 2014-05-27: qty 2

## 2014-05-27 MED ORDER — ROCURONIUM BROMIDE 100 MG/10ML IV SOLN
INTRAVENOUS | Status: DC | PRN
  Administered 2014-05-27: 35 mg via INTRAVENOUS

## 2014-05-27 SURGICAL SUPPLY — 33 items
ADH SKN CLS APL DERMABOND .7 (GAUZE/BANDAGES/DRESSINGS) ×1
CABLE HIGH FREQUENCY MONO STRZ (ELECTRODE) IMPLANT
CATH FOLEY 2WAY  3CC 10FR (CATHETERS)
CATH FOLEY 2WAY 3CC 10FR (CATHETERS) IMPLANT
CATH ROBINSON RED A/P 16FR (CATHETERS) ×1 IMPLANT
CHLORAPREP W/TINT 26ML (MISCELLANEOUS) ×2 IMPLANT
CLOTH BEACON ORANGE TIMEOUT ST (SAFETY) ×2 IMPLANT
DECANTER SPIKE VIAL GLASS SM (MISCELLANEOUS) ×2 IMPLANT
DERMABOND ADVANCED (GAUZE/BANDAGES/DRESSINGS) ×1
DERMABOND ADVANCED .7 DNX12 (GAUZE/BANDAGES/DRESSINGS) ×1 IMPLANT
DILATOR CANAL MILEX (MISCELLANEOUS) ×1 IMPLANT
DRSG COVADERM PLUS 2X2 (GAUZE/BANDAGES/DRESSINGS) ×3 IMPLANT
DRSG OPSITE POSTOP 3X4 (GAUZE/BANDAGES/DRESSINGS) ×1 IMPLANT
GLOVE BIO SURGEON STRL SZ8 (GLOVE) ×2 IMPLANT
GLOVE ORTHO TXT STRL SZ7.5 (GLOVE) ×2 IMPLANT
GOWN STRL REUS W/TWL LRG LVL3 (GOWN DISPOSABLE) ×4 IMPLANT
NDL EPID 17G 5 ECHO TUOHY (NEEDLE) IMPLANT
NEEDLE EPID 17G 5 ECHO TUOHY (NEEDLE) IMPLANT
NEEDLE INSUFFLATION 120MM (ENDOMECHANICALS) ×2 IMPLANT
NS IRRIG 1000ML POUR BTL (IV SOLUTION) ×2 IMPLANT
PACK LAPAROSCOPY BASIN (CUSTOM PROCEDURE TRAY) ×2 IMPLANT
PROTECTOR NERVE ULNAR (MISCELLANEOUS) ×2 IMPLANT
SET IRRIG TUBING LAPAROSCOPIC (IRRIGATION / IRRIGATOR) IMPLANT
SHEARS HARMONIC ACE PLUS 36CM (ENDOMECHANICALS) ×1 IMPLANT
SOLUTION ELECTROLUBE (MISCELLANEOUS) IMPLANT
SUT VICRYL 0 UR6 27IN ABS (SUTURE) IMPLANT
SUT VICRYL 4-0 PS2 18IN ABS (SUTURE) ×2 IMPLANT
TOWEL OR 17X24 6PK STRL BLUE (TOWEL DISPOSABLE) ×4 IMPLANT
TROCAR XCEL NON-BLD 11X100MML (ENDOMECHANICALS) ×1 IMPLANT
TROCAR XCEL NON-BLD 5MMX100MML (ENDOMECHANICALS) ×2 IMPLANT
TROCAR XCEL OPT SLVE 5M 100M (ENDOMECHANICALS) IMPLANT
WARMER LAPAROSCOPE (MISCELLANEOUS) ×2 IMPLANT
WATER STERILE IRR 1000ML POUR (IV SOLUTION) ×2 IMPLANT

## 2014-05-27 NOTE — Anesthesia Postprocedure Evaluation (Signed)
  Anesthesia Post Note  Patient: Christina Hoffman  Procedure(s) Performed: Procedure(s) (LRB): LAPAROSCOPY WITH  LYSIS OF ADHESIONS (N/A)  Anesthesia type: GA  Patient location: PACU  Post pain: Pain level controlled  Post assessment: Post-op Vital signs reviewed  Last Vitals:  Filed Vitals:   05/27/14 0900  BP: 98/54  Pulse: 65  Temp:   Resp: 21    Post vital signs: Reviewed  Level of consciousness: sedated  Complications: No apparent anesthesia complications

## 2014-05-27 NOTE — Anesthesia Procedure Notes (Signed)
Procedure Name: Intubation Date/Time: 05/27/2014 7:25 AM Performed by: Shanon PayorGREGORY, Yasira Engelson M Pre-anesthesia Checklist: Suction available, Emergency Drugs available, Timeout performed, Patient identified and Patient being monitored Patient Re-evaluated:Patient Re-evaluated prior to inductionOxygen Delivery Method: Circle system utilized Preoxygenation: Pre-oxygenation with 100% oxygen Intubation Type: IV induction Ventilation: Mask ventilation without difficulty Laryngoscope Size: Mac and 3 Grade View: Grade I Tube type: Oral Tube size: 7.0 mm Number of attempts: 1 Airway Equipment and Method: Stylet Placement Confirmation: ETT inserted through vocal cords under direct vision,  positive ETCO2 and breath sounds checked- equal and bilateral Secured at: 21 cm Tube secured with: Tape Dental Injury: Teeth and Oropharynx as per pre-operative assessment

## 2014-05-27 NOTE — Anesthesia Preprocedure Evaluation (Signed)
Anesthesia Evaluation  Patient identified by MRN, date of birth, ID band Patient awake    Reviewed: Allergy & Precautions, H&P , Patient's Chart, lab work & pertinent test results, reviewed documented beta blocker date and time   Airway Mallampati: III TM Distance: >3 FB Neck ROM: full    Dental no notable dental hx.    Pulmonary  breath sounds clear to auscultation  Pulmonary exam normal       Cardiovascular Rhythm:regular Rate:Normal     Neuro/Psych    GI/Hepatic   Endo/Other    Renal/GU      Musculoskeletal   Abdominal   Peds  Hematology   Anesthesia Other Findings Hx of awareness with GA...'91 at [facility] Had GA since, no problem Upper R cap  Reproductive/Obstetrics                           Anesthesia Physical Anesthesia Plan  ASA: II  Anesthesia Plan: General   Post-op Pain Management:    Induction: Intravenous  Airway Management Planned: Oral ETT  Additional Equipment:   Intra-op Plan:   Post-operative Plan: Extubation in OR  Informed Consent: I have reviewed the patients History and Physical, chart, labs and discussed the procedure including the risks, benefits and alternatives for the proposed anesthesia with the patient or authorized representative who has indicated his/her understanding and acceptance.   Dental Advisory Given and Dental advisory given  Plan Discussed with: CRNA and Surgeon  Anesthesia Plan Comments: (  Discussed general anesthesia, including possible nausea, instrumentation of airway, sore throat,pulmonary aspiration, etc. I asked if the were any outstanding questions, or  concerns before we proceeded. )        Anesthesia Quick Evaluation

## 2014-05-27 NOTE — Interval H&P Note (Signed)
History and Physical Interval Note:  05/27/2014 7:09 AM  Christina Hoffman  has presented today for surgery, with the diagnosis of Pelvic pain, [id number], [phone]  The various methods of treatment have been discussed with the patient and family. After consideration of risks, benefits and other options for treatment, the patient has consented to  Procedure(s): LAPAROSCOPY DIAGNOSTIC, possible Operative Laparoscopy (N/A) as a surgical intervention .  The patient's history has been reviewed, patient examined, no change in status, stable for surgery.  I have reviewed the patient's chart and labs.  Questions were answered to the patient's satisfaction.     Demarquez Ciolek D

## 2014-05-27 NOTE — Discharge Instructions (Signed)
DISCHARGE INSTRUCTIONS: Laparoscopy  The following instructions have been prepared to help you care for yourself upon your return home today.  No Ibuprofen containing products (ie. Advil, Aleve, Motrin, etc.) until after 2:15pm today.  Wound care:  Do not get the incision wet for the first 24 hours. The incision should be kept clean and dry.  The Band-Aids or dressings may be removed the day after surgery.  Should the incision become sore, red, and swollen after the first week, check with your doctor.  Personal hygiene:  Shower the day after your procedure.  Activity and limitations:  Do NOT drive or operate any equipment today.  Do NOT lift anything more than 15 pounds for 2-3 weeks after surgery.  Do NOT rest in bed all day.  Walking is encouraged. Walk each day, starting slowly with 5-minute walks 3 or 4 times a day. Slowly increase the length of your walks.  Walk up and down stairs slowly.  Do NOT do strenuous activities, such as golfing, playing tennis, bowling, running, biking, weight lifting, gardening, mowing, or vacuuming for 2-4 weeks. Ask your doctor when it is okay to start.  Diet: Eat a light meal as desired this evening. You may resume your usual diet tomorrow.  Return to work: This is dependent on the type of work you do. For the most part you can return to a desk job within a week of surgery. If you are more active at work, please discuss this with your doctor.  What to expect after your surgery: You may have a slight burning sensation when you urinate on the first day. You may have a very small amount of blood in the urine. Expect to have a small amount of vaginal discharge/light bleeding for 1-2 weeks. It is not unusual to have abdominal soreness and bruising for up to 2 weeks. You may be tired and need more rest for about 1 week. You may experience shoulder pain for 24-72 hours. Lying flat in bed may relieve it.  Call your doctor for any of the following:   Develop a fever of 100.4 or greater  Inability to urinate 6 hours after discharge from hospital  Severe pain not relieved by pain medications  Persistent of heavy bleeding at incision site  Redness or swelling around incision site after a week  Increasing nausea or vomiting  Patient Signature________________________________________ Nurse Signature_________________________________________

## 2014-05-27 NOTE — Transfer of Care (Signed)
Immediate Anesthesia Transfer of Care Note  Patient: Christina Hoffman  Procedure(s) Performed: Procedure(s): LAPAROSCOPY WITH  LYSIS OF ADHESIONS (N/A)  Patient Location: PACU  Anesthesia Type:General  Level of Consciousness: awake, alert  and oriented  Airway & Oxygen Therapy: Patient Spontanous Breathing and Patient connected to nasal cannula oxygen  Post-op Assessment: Report given to PACU RN and Post -op Vital signs reviewed and stable  Post vital signs: Reviewed and stable  Complications: No apparent anesthesia complications

## 2014-05-27 NOTE — Op Note (Signed)
Preoperative diagnosis: Pelvic pain Postoperative diagnosis: Pelvic pain, pelvic and abdominal adhesions Procedure: Open laparoscopy, adhesiolysis Surgeon: Lavina Hammanodd Misha Antonini M.D. Anesthesia: Gen. Findings: She had an essentially normal uterus tubes and ovaries. Her appendix was normal with some adhesions in the right lower quadrant. There also adhesions in the left lower quadrant. Omentum was adherent to the anterior abdominal wall. There appears to be a separation of the rectus muscles in the midline from her previous C-section. Estimated blood loss: Minimal Specimens: None Complications: None  Procedure in detail:  The patient was taken to the operating room and placed in the dorsal supine position. General anesthesia was induced and she was placed and mobile stirrups. Abdomen perineum and vagina were then prepped and draped in the usual sterile fashion, bladder drained with a red Robinson catheter, an  Acorn cannula used in the cervix to help manipulate the uterus. Infraumbilical skin was then infiltrated with quarter percent Marcaine and a 3 cm horizontal incision was made. Dissection was carried down to the fascia which was grasped and elevated. Fascia was entered sharply and this also entered the peritoneal cavity. I inserted a finger and swept around the incision, there were some adhesions inferiorly. A pursestring suture of 0 Vicryl was placed and a Hassan cannula with the balloon was then inserted. The abdomen was insufflated with CO2 and the laparoscope was inserted. Good visualization was achieved and the above-mentioned findings were noted. A 5 mm port was placed on the left side under direct visualization. Using the Harmonic Scalpel ACE all of the adhesions in the right lower quadrant were taken down careful to avoid the appendix and the bowel. She also had adhesions in the right upper quadrant from her previous cholecystectomy. One of these adhesions of the omentum to the anterior abdominal  wall was taken down with harmonic scalpel. The omental adhesions to the anterior abdomen in the midline were taken down with harmonic scalpel. Adhesions in the left lower quadrant also taken of the Harmonic Scalpel. Careful inspection of the pelvis revealed no other source for her pain, specifically no endometriosis. Her anterior abdominal wall appeared unusual but there was nothing else stuck to the abdominal wall and I did not feel that surgery there would help her pain. All areas were inspected and found to be hemostatic. The 5 mm port on the left side was removed under direct visualization. The Hassan cannula was removed and all gas allowed to deflate from the abdomen. The pursestring suture was tied in this achieved good fascial closure. Skin incisions were closed with interrupted subcuticular sutures of 4-0 Vicryl followed by Dermabond. The Acorn tenaculum and single-tooth tenaculum were removed from the cervix. She was awakened in the operating and taken to the recovery in stable condition. Counts were correct, she had PAS hose on throughout the procedure.

## 2014-05-28 ENCOUNTER — Encounter (HOSPITAL_COMMUNITY): Payer: Self-pay | Admitting: Obstetrics and Gynecology

## 2015-05-18 ENCOUNTER — Ambulatory Visit (INDEPENDENT_AMBULATORY_CARE_PROVIDER_SITE_OTHER): Payer: Self-pay | Admitting: Emergency Medicine

## 2015-05-18 VITALS — BP 122/74 | HR 88 | Temp 99.1°F | Resp 18 | Ht 61.25 in | Wt 202.2 lb

## 2015-05-18 DIAGNOSIS — H6123 Impacted cerumen, bilateral: Secondary | ICD-10-CM

## 2015-05-18 NOTE — Progress Notes (Signed)
Subjective:  Patient ID: Christina Hoffman, female    DOB: [birth year]  Age: 44 y.o. MRN: [id number]  CC: Ear Fullness and Decreased Hearing   HPI Christina Hoffman presents  with sudden pain and decreased hearing in her ears. She said she was vomiting over the weekend and started new job that requires her to constantly type. 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 She's been unable to successfully accomplish that task since the weekend. Denies any nasal congestion postnasal drainage fever chills cough coryza or runny nose. Has no wheezing or shortness of breath. Has no fever or chills.  History Christina Hoffman has a past medical history of Polycystic ovarian syndrome; Complication of anesthesia (1991); and Allergy.   She has past surgical history that includes Cesarean section; Cholecystectomy (23 years ago); and laparoscopy (N/A, 05/27/2014).   Her  family history is not on file.  She   reports that she has never smoked. She has never used smokeless tobacco. She reports that she does not drink alcohol or use illicit drugs.  Outpatient Prescriptions Prior to Visit  Medication Sig Dispense Refill  . Norgestimate-Ethinyl Estradiol Triphasic (ORTHO TRI-CYCLEN LO) 0.18/0.215/0.25 MG-25 MCG tab Take 1 tablet by mouth daily. 3 Package 3  . Cetirizine HCl 1 MG/ML SOLN Take 10 mg by mouth daily.    . cholecalciferol (VITAMIN D) 1000 UNITS tablet Take 1,000 Units by mouth daily.    . fluticasone (FLONASE) 50 MCG/ACT nasal spray Place 1 spray into both nostrils daily as needed for allergies or rhinitis.    Marland Kitchen ibuprofen (ADVIL,MOTRIN) 200 MG tablet Take 400 mg by mouth every 6 (six) hours as needed for pain.    . meloxicam (MOBIC) 15 MG tablet Take 15 mg by mouth daily.    . metformin (FORTAMET) 500 MG (OSM) 24 hr tablet Take 250 mg by mouth daily.    . naproxen (NAPROSYN) 375 MG tablet Take 375 mg by mouth 2 (two) times daily as needed (pain).    Marland Kitchen oxyCODONE-acetaminophen (ROXICET) 5-325 MG per tablet  Take 1-2 tablets by mouth every 4 (four) hours as needed for severe pain. (Patient not taking: Reported on 05/18/2015) 30 tablet 0  . phentermine 37.5 MG capsule Take 37.5 mg by mouth every morning.    . pravastatin (PRAVACHOL) 20 MG tablet Take 20 mg by mouth daily.    . predniSONE (DELTASONE) 10 MG tablet Take 50 mg by mouth once.    . topiramate (TOPAMAX) 25 MG tablet Take 25 mg by mouth daily.    . traMADol (ULTRAM) 50 MG tablet Take 50 mg by mouth every 6 (six) hours as needed for pain.     No facility-administered medications prior to visit.    History   Social History  . Marital Status: Single    Spouse Name: N/A  . Number of Children: N/A  . Years of Education: N/A   Social History Main Topics  . Smoking status: Never Smoker   . Smokeless tobacco: Never Used  . Alcohol Use: No  . Drug Use: No  . Sexual Activity: Not Currently    Birth Control/ Protection: Pill   Other Topics Concern  . None   Social History Narrative     Review of Systems  Constitutional: Negative for fever, chills and appetite change.  HENT: Positive for ear pain and hearing loss. Negative for congestion, postnasal drip, sinus pressure and sore throat.   Eyes: Negative for pain and redness.  Respiratory: Negative for cough, shortness of breath  and wheezing.   Cardiovascular: Negative for leg swelling.  Gastrointestinal: Negative for nausea, vomiting, abdominal pain, diarrhea, constipation and blood in stool.  Endocrine: Negative for polyuria.  Genitourinary: Negative for dysuria, urgency, frequency and flank pain.  Musculoskeletal: Negative for gait problem.  Skin: Negative for rash.  Neurological: Negative for weakness and headaches.  Psychiatric/Behavioral: Negative for confusion and decreased concentration. The patient is not nervous/anxious.     Objective:  BP 122/74 mmHg  Pulse 88  Temp(Src) 99.1 F (37.3 C) (Oral)  Resp 18  Ht 5' 1.25" (1.556 m)  Wt 202 lb 3.2 oz (91.717 kg)  BMI  37.88 kg/m2  SpO2 99%  Physical Exam  Constitutional: She is oriented to person, place, and time. She appears well-developed and well-nourished. No distress.  HENT:  Head: Normocephalic and atraumatic.  Right Ear: External ear normal. A foreign body is present.  Left Ear: External ear normal. A foreign body is present.  Nose: Nose normal.  Eyes: Conjunctivae and EOM are normal. Pupils are equal, round, and reactive to light. No scleral icterus.  Neck: Normal range of motion. Neck supple. No tracheal deviation present.  Cardiovascular: Normal rate, regular rhythm and normal heart sounds.   Pulmonary/Chest: Effort normal. No respiratory distress. She has no wheezes. She has no rales.  Abdominal: She exhibits no mass. There is no tenderness. There is no rebound and no guarding.  Musculoskeletal: She exhibits no edema.  Lymphadenopathy:    She has no cervical adenopathy.  Neurological: She is alert and oriented to person, place, and time. Coordination normal.  Skin: Skin is warm and dry. No rash noted.  Psychiatric: She has a normal mood and affect. Her behavior is normal.      Assessment & Plan:   There are no diagnoses linked to this encounter. I am having Ms. Christina Hoffman maintain her traMADol, naproxen, metformin, ibuprofen, Norgestimate-Ethinyl Estradiol Triphasic, phentermine, topiramate, Cetirizine HCl, fluticasone, pravastatin, cholecalciferol, meloxicam, predniSONE, and oxyCODONE-acetaminophen.  No orders of the defined types were placed in this encounter.     her bilateral cerumen impactions were irrigated free atraumatically in her hearing was restored.   Appropriate red flag conditions were discussed with the patient as well as actions that should be taken.  Patient expressed his understanding.  Follow-up: No Follow-up on file.  Carmelina Dane, MD

## 2015-06-08 ENCOUNTER — Ambulatory Visit (INDEPENDENT_AMBULATORY_CARE_PROVIDER_SITE_OTHER): Payer: Managed Care, Other (non HMO) | Admitting: Urgent Care

## 2015-06-08 VITALS — BP 104/70 | HR 87 | Temp 99.1°F | Resp 14 | Ht 61.5 in | Wt 204.0 lb

## 2015-06-08 DIAGNOSIS — Z566 Other physical and mental strain related to work: Secondary | ICD-10-CM

## 2015-06-08 DIAGNOSIS — F32A Depression, unspecified: Secondary | ICD-10-CM

## 2015-06-08 DIAGNOSIS — R457 State of emotional shock and stress, unspecified: Secondary | ICD-10-CM

## 2015-06-08 DIAGNOSIS — F419 Anxiety disorder, unspecified: Principal | ICD-10-CM

## 2015-06-08 DIAGNOSIS — F418 Other specified anxiety disorders: Secondary | ICD-10-CM

## 2015-06-08 DIAGNOSIS — F329 Major depressive disorder, single episode, unspecified: Secondary | ICD-10-CM

## 2015-06-08 MED ORDER — LORAZEPAM 0.5 MG PO TABS: 0.5000 mg | ORAL_TABLET | Freq: Two times a day (BID) | ORAL | Status: DC | PRN

## 2015-06-08 MED ORDER — FLUOXETINE HCL 20 MG PO TABS: 20.0000 mg | ORAL_TABLET | Freq: Every day | ORAL | Status: DC

## 2015-06-08 NOTE — Patient Instructions (Addendum)
- For a therapist, contact [facility]. Britta Mccreedy and/or Darl Pikes can be of great help to you. - Below is their contact information:  [address]  [place], [place] [address]  Phone: [phone]  Fax: [phone]   Fluoxetine capsules or tablets (Depression/Mood Disorders) What is this medicine? FLUOXETINE (floo OX e teen) belongs to a class of drugs known as selective serotonin reuptake inhibitors (SSRIs). It helps to treat mood problems such as depression, obsessive compulsive disorder, and panic attacks. It can also treat certain eating disorders. This medicine may be used for other purposes; ask your health care provider or pharmacist if you have questions. COMMON BRAND NAME(S): Prozac What should I tell my health care provider before I take this medicine? They need to know if you have any of these conditions: -bipolar disorder or mania -diabetes -glaucoma -liver disease -psychosis -seizures -suicidal thoughts or history of attempted suicide -an unusual or allergic reaction to fluoxetine, other medicines, foods, dyes, or preservatives -pregnant or trying to get pregnant -breast-feeding How should I use this medicine? Take this medicine by mouth with a glass of water. Follow the directions on the prescription label. You can take this medicine with or without food. Take your medicine at regular intervals. Do not take it more often than directed. Do not stop taking this medicine suddenly except upon the advice of your doctor. Stopping this medicine too quickly may cause serious side effects or your condition may worsen. A special MedGuide will be given to you by the pharmacist with each prescription and refill. Be sure to read this information carefully each time. Talk to your pediatrician regarding the use of this medicine in children. While this drug may be prescribed for children as young as 7 years for selected conditions, precautions do apply. Overdosage: If you  think you have taken too much of this medicine contact a poison control center or emergency room at once. NOTE: This medicine is only for you. Do not share this medicine with others. What if I miss a dose? If you miss a dose, skip the missed dose and go back to your regular dosing schedule. Do not take double or extra doses. What may interact with this medicine? Do not take fluoxetine with any of the following medications: -other medicines containing fluoxetine, like Sarafem or Symbyax -cisapride -linezolid -MAOIs like Carbex, Eldepryl, Marplan, Nardil, and Parnate -methylene blue (injected into a vein) -pimozide -thioridazine This medicine may also interact with the following medications: -alcohol -aspirin and aspirin-like medicines -carbamazepine -certain medicines for depression, anxiety, or psychotic disturbances -certain medicines for migraine headaches like almotriptan, eletriptan, frovatriptan, naratriptan, rizatriptan, sumatriptan, zolmitriptan -digoxin -diuretics -fentanyl -flecainide -furazolidone -isoniazid -lithium -medicines for sleep -medicines that treat or prevent blood clots like warfarin, enoxaparin, and dalteparin -NSAIDs, medicines for pain and inflammation, like ibuprofen or naproxen -phenytoin -procarbazine -propafenone -rasagiline -ritonavir -supplements like St. John's wort, kava kava, valerian -tramadol -tryptophan -vinblastine This list may not describe all possible interactions. Give your health care provider a list of all the medicines, herbs, non-prescription drugs, or dietary supplements you use. Also tell them if you smoke, drink alcohol, or use illegal drugs. Some items may interact with your medicine. What should I watch for while using this medicine? Tell your doctor if your symptoms do not get better or if they get worse. Visit your doctor or health care professional for regular checks on your progress. Because it may take several weeks to  see the full effects of this medicine, it is important  to continue your treatment as prescribed by your doctor. Patients and their families should watch out for new or worsening thoughts of suicide or depression. Also watch out for sudden changes in feelings such as feeling anxious, agitated, panicky, irritable, hostile, aggressive, impulsive, severely restless, overly excited and hyperactive, or not being able to sleep. If this happens, especially at the beginning of treatment or after a change in dose, call your health care professional. Bonita QuinYou may get drowsy or dizzy. Do not drive, use machinery, or do anything that needs mental alertness until you know how this medicine affects you. Do not stand or sit up quickly, especially if you are an older patient. This reduces the risk of dizzy or fainting spells. Alcohol may interfere with the effect of this medicine. Avoid alcoholic drinks. Your mouth may get dry. Chewing sugarless gum or sucking hard candy, and drinking plenty of water may help. Contact your doctor if the problem does not go away or is severe. This medicine may affect blood sugar levels. If you have diabetes, check with your doctor or health care professional before you change your diet or the dose of your diabetic medicine. What side effects may I notice from receiving this medicine? Side effects that you should report to your doctor or health care professional as soon as possible: -allergic reactions like skin rash, itching or hives, swelling of the face, lips, or tongue -breathing problems -confusion -eye pain, changes in vision -fast or irregular heart rate, palpitations -flu-like fever, chills, cough, muscle or joint aches and pains -seizures -suicidal thoughts or other mood changes -tremors -trouble sleeping -unusual bleeding or bruising -unusually tired or weak -vomiting Side effects that usually do not require medical attention (report to your doctor or health care professional  if they continue or are bothersome): -change in sex drive or performance -diarrhea -dry mouth -flushing -headache -increased or decreased appetite -nausea -sweating This list may not describe all possible side effects. Call your doctor for medical advice about side effects. You may report side effects to FDA at 1-800-FDA-1088. Where should I keep my medicine? Keep out of the reach of children. Store at room temperature between 15 and 30 degrees C (59 and 86 degrees F). Throw away any unused medicine after the expiration date. NOTE: This sheet is a summary. It may not cover all possible information. If you have questions about this medicine, talk to your doctor, pharmacist, or health care provider.  2015, Elsevier/Gold Standard. (2014-01-06 18:27:34)    Lorazepam tablets What is this medicine? LORAZEPAM (lor A ze pam) is a benzodiazepine. It is used to treat anxiety. This medicine may be used for other purposes; ask your health care provider or pharmacist if you have questions. COMMON BRAND NAME(S): Ativan What should I tell my health care provider before I take this medicine? They need to know if you have any of these conditions: -alcohol or drug abuse problem -bipolar disorder, depression, psychosis or other mental health condition -glaucoma -kidney or liver disease -lung disease or breathing difficulties -myasthenia gravis -Parkinson's disease -seizures or a history of seizures -suicidal thoughts -an unusual or allergic reaction to lorazepam, other benzodiazepines, foods, dyes, or preservatives -pregnant or trying to get pregnant -breast-feeding How should I use this medicine? Take this medicine by mouth with a glass of water. Follow the directions on the prescription label. If it upsets your stomach, take it with food or milk. Take your medicine at regular intervals. Do not take it more often than directed. Do not  stop taking except on the advice of your doctor or health  care professional. Talk to your pediatrician regarding the use of this medicine in children. Special care may be needed. Overdosage: If you think you have taken too much of this medicine contact a poison control center or emergency room at once. NOTE: This medicine is only for you. Do not share this medicine with others. What if I miss a dose? If you miss a dose, take it as soon as you can. If it is almost time for your next dose, take only that dose. Do not take double or extra doses. What may interact with this medicine? -barbiturate medicines for inducing sleep or treating seizures, like phenobarbital -clozapine -medicines for depression, mental problems or psychiatric disturbances -medicines for sleep -phenytoin -probenecid -theophylline -valproic acid This list may not describe all possible interactions. Give your health care provider a list of all the medicines, herbs, non-prescription drugs, or dietary supplements you use. Also tell them if you smoke, drink alcohol, or use illegal drugs. Some items may interact with your medicine. What should I watch for while using this medicine? Visit your doctor or health care professional for regular checks on your progress. Your body may become dependent on this medicine, ask your doctor or health care professional if you still need to take it. However, if you have been taking this medicine regularly for some time, do not suddenly stop taking it. You must gradually reduce the dose or you may get severe side effects. Ask your doctor or health care professional for advice before increasing or decreasing the dose. Even after you stop taking this medicine it can still affect your body for several days. You may get drowsy or dizzy. Do not drive, use machinery, or do anything that needs mental alertness until you know how this medicine affects you. To reduce the risk of dizzy and fainting spells, do not stand or sit up quickly, especially if you are an older  patient. Alcohol may increase dizziness and drowsiness. Avoid alcoholic drinks. Do not treat yourself for coughs, colds or allergies without asking your doctor or health care professional for advice. Some ingredients can increase possible side effects. What side effects may I notice from receiving this medicine? Side effects that you should report to your doctor or health care professional as soon as possible: -changes in vision -confusion -depression -mood changes, excitability or aggressive behavior -movement difficulty, staggering or jerky movements -muscle cramps -restlessness -weakness or tiredness Side effects that usually do not require medical attention (report to your doctor or health care professional if they continue or are bothersome): -constipation or diarrhea -difficulty sleeping, nightmares -dizziness, drowsiness -headache -nausea, vomiting This list may not describe all possible side effects. Call your doctor for medical advice about side effects. You may report side effects to FDA at 1-800-FDA-1088. Where should I keep my medicine? Keep out of the reach of children. This medicine can be abused. Keep your medicine in a safe place to protect it from theft. Do not share this medicine with anyone. Selling or giving away this medicine is dangerous and against the law. Store at room temperature between 20 and 25 degrees C (68 and 77 degrees F). Protect from light. Keep container tightly closed. Throw away any unused medicine after the expiration date. NOTE: This sheet is a summary. It may not cover all possible information. If you have questions about this medicine, talk to your doctor, pharmacist, or health care provider.  2015, Elsevier/Gold Standard. (2008-05-14  14:58:20)  

## 2015-06-08 NOTE — Progress Notes (Signed)
MRN: [id number] DOB: 11/08/1971  Subjective:   Christina Hoffman is a 44 y.o. female presenting for chief complaint of Stress and Anxiety  Reports 2 week history of significant stress with her work. She works as an Environmental health practitioneradministrative assistant; however, patient was coy about her work Counselling psychologistresponsibilities, stating that she has to provide Warden/ranger"privacy for her clients/work". Patient was homemaker for several years and recently started working ~1.5 months ago. She did say that she gets overwhelmed with her boss who demands "perfection" and feels like she puts more pressure on herself as well to do things perfect for her work. She works ~40 hours per week, experiences daily episodes of anxiety with subsequent crying spells and is interrupting her work. At home, she has difficulty focusing and leaving her work behind. She feels anxious about not sleeping well, has decreased appetite, has feelings of guilt when she feels like she doesn't do her work perfectly, feels like she hits walls mentally both at work and at home and can't move past her "roadblocks". Of note, patient also reports losing 3 close friends in the last 2 months, they passed away unexpectedly. She has never tried any psychiatric medicines before. However, she thinks that she received a small dose of Xanax after pregnancy for an unknown reason. She denies homicidal ideation or suicidal ideation. States that she has a good support network with friends and family. She is divorced, lives on her own and shares custody of her 46 year old daughter. Denies smoking or alcohol use. Denies any other aggravating or relieving factors, no other questions or concerns.  [name] has a current medication list which includes the following prescription(s): norgestimate-ethinyl estradiol triphasic. She has No Known Allergies.  [name]  has a past medical history of Polycystic ovarian syndrome; Complication of anesthesia (1991); and Allergy. Also  has past surgical history that includes  Cesarean section; Cholecystectomy (23 years ago); and laparoscopy (N/A, 05/27/2014).  ROS As in subjective.  Objective:   Vitals: BP 104/70 mmHg  Pulse 87  Temp(Src) 99.1 F (37.3 C) (Oral)  Resp 14  Ht 5' 1.5" (1.562 m)  Wt 204 lb (92.534 kg)  BMI 37.93 kg/m2  SpO2 97%  Physical Exam  Constitutional: She is oriented to person, place, and time. She appears well-developed and well-nourished.  HENT:  Mouth/Throat: Oropharynx is clear and moist.  Eyes: Conjunctivae are normal. Pupils are equal, round, and reactive to light. No scleral icterus.  Neck: Normal range of motion. Neck supple. No thyromegaly present.  Cardiovascular: Normal rate, regular rhythm and intact distal pulses.   Pulmonary/Chest: No respiratory distress. She has no wheezes. She has no rales.  Abdominal: Soft. Bowel sounds are normal. She exhibits no distension and no mass. There is no tenderness.  Neurological: She is alert and oriented to person, place, and time.  Skin: Skin is warm and dry. No rash noted. No erythema. No pallor.  Psychiatric: Her mood appears anxious. Her speech is not rapid and/or pressured, not delayed and not slurred. She is agitated. She is not aggressive and not slowed. Thought content is not paranoid and not delusional. She exhibits a depressed mood (tearful throughout most of the visit). She expresses no homicidal and no suicidal ideation.   Assessment and Plan :   1. Anxiety and depression 2. Emotional stress 3. Work-related stress - Start trial of Prozac, discussed maintenance therapy and the need for trials of SSRI to be at least 6-8 weeks, patient may benefit from 1 year of Prozac. Patient preferred  to stay away from Xanax but was agreeable to trying Ativan for breakthrough anxiety. Oddly, patient stated that she may not fill rx because she is opposed to medications but came because she needed medication for anxiety. Either way, I asked her to follow up with me in 4 weeks.  Wallis Bamberg,  PA-C [facility] [phone] 06/08/2015 6:23 PM

## 2015-06-10 ENCOUNTER — Ambulatory Visit (INDEPENDENT_AMBULATORY_CARE_PROVIDER_SITE_OTHER): Payer: Managed Care, Other (non HMO) | Admitting: Physician Assistant

## 2015-06-10 VITALS — BP 132/84 | HR 97 | Temp 98.3°F | Resp 22

## 2015-06-10 DIAGNOSIS — Z7729 Contact with and (suspected ) exposure to other hazardous substances: Secondary | ICD-10-CM

## 2015-06-10 DIAGNOSIS — F410 Panic disorder [episodic paroxysmal anxiety] without agoraphobia: Secondary | ICD-10-CM

## 2015-06-10 LAB — POCT URINE PREGNANCY: Preg Test, Ur: NEGATIVE

## 2015-06-10 NOTE — Progress Notes (Signed)
Christina Hoffman  MRN: [id number] DOB: 07/24/1971  Subjective:  Pt presents to clinic with concerns about a possible exposure to natural gas and anxiety related to this.  She was at work this afternoon and was evacuated at work around [employer]3pm and was not told by her supervisors why they were being evacuated.  When they were told they could go back into the building they past people in white suits with masks over their faces venting a gas valve that smelled of natural gas.  People around her were asking questions and getting concerned and this caused her to get worried and she started to feel panicky and she started to have fast breathing and chest tightness and she was very anxious.  She went back to work but she was unable to focus due to her anxiety.  She was crying uncontrollable and unable to control her crying, she felt SOB, finger tingling, increased breathing - she has never had this type of panic attack in the past - she felt unsafe in her situation and she is afraid to go back to work.  She is also still involved in a custody case with her ex-husband - her daughter is 718 y/o.  That is stressful but she feels like that is not the current problem.  She has been diagnosed with PTSD in the past and had therapy for it in the past.  Works with [employer].  Rosaura CarpenterFriend Michael with her tonight.  Patient Active Problem List   Diagnosis Date Noted  . Anxiety and depression 06/08/2015  . Work-related stress 06/08/2015  . Emotional stress 06/08/2015  . Pelvic pain in female 05/27/2014  . Pelvic peritoneal adhesions, female 05/27/2014  . Other and unspecified ovarian cyst 10/02/2013    Current Outpatient Prescriptions on File Prior to Visit  Medication Sig Dispense Refill  . Norgestimate-Ethinyl Estradiol Triphasic (ORTHO TRI-CYCLEN LO) 0.18/0.215/0.25 MG-25 MCG tab Take 1 tablet by mouth daily. 3 Package 3  . FLUoxetine (PROZAC) 20 MG tablet Take 1 tablet (20 mg total) by mouth daily. (Patient not taking:  Reported on 06/10/2015) 30 tablet 3  . LORazepam (ATIVAN) 0.5 MG tablet Take 1 tablet (0.5 mg total) by mouth 2 (two) times daily as needed for anxiety. (Patient not taking: Reported on 06/10/2015) 30 tablet 1   No current facility-administered medications on file prior to visit.    No Known Allergies  Review of Systems  Constitutional: Negative for fever and chills.  Psychiatric/Behavioral: Positive for sleep disturbance. Negative for self-injury and dysphoric mood. The patient is nervous/anxious.    Objective:  BP 132/84 mmHg  Pulse 97  Temp(Src) 98.3 F (36.8 C) (Oral)  Resp 22  SpO2 99%  LMP  (LMP Unknown)  Physical Exam  Constitutional: She is oriented to person, place, and time and well-developed, well-nourished, and in no distress.  HENT:  Head: Normocephalic and atraumatic.  Right Ear: Hearing and external ear normal.  Left Ear: Hearing and external ear normal.  Eyes: Conjunctivae are normal.  Neck: Normal range of motion.  Pulmonary/Chest: Effort normal.  Neurological: She is alert and oriented to person, place, and time. Gait normal.  Skin: Skin is warm and dry.  Psychiatric: Memory, affect and judgment normal. Her mood appears anxious (she was very anxious at the 1st part of the visit but she was calm by the end of the visit).  Vitals reviewed.  Results for orders placed or performed in visit on 06/10/15  POCT urine pregnancy  Result Value Ref Range  Preg Test, Ur Negative Negative   Spent >45 mins in room with the patient with >50% spent in couseling Assessment and Plan :  Exposure to natural gas - Plan: POCT urine pregnancy  Panic attacks  D/w pt at length that I know of no test to check for natural gas problems.  She is not pregnant which was what she was most worried about in regards to the possible exposure to natural gas.  We talked about anxiety and stress and ways to treat them.  She is really not interested in medications at this time (we talked about  rescue type medications and long term daily medications.) she has had therapy before for her PTSD and that really helped and I strongly encouraged her to do that again.  That should help her cope and find a constructive way to deal with her stress at work in regards to her trainer.  She was taken out of work for the next week to allow her to start therapy and to separate her for a few days from her excess stress.  We talked at length about just not going to work and that is not the best option but rather a separation while doing therapy.    Benny Lennert PA-C  [facility] 06/10/2015 6:51 PM

## 2015-06-13 ENCOUNTER — Telehealth: Payer: Self-pay | Admitting: Physician Assistant

## 2015-06-13 NOTE — Telephone Encounter (Signed)
[facility] faxed an Attending Physician Statement to be completed. They are also requesting records. Patient was seen on 06/08/2015 and 06/10/2015 for panic attacks, emotional stress, and work-related stress (among other things). Patient has filed a disability claim and this form has to be completed. Please return to FMLA/Disability when finished. Placed in your box on 06/13/2015.

## 2015-06-14 NOTE — Progress Notes (Signed)
  Medical screening examination/treatment/procedure(s) were performed by non-physician practitioner and as supervising physician I was immediately available for consultation/collaboration.     

## 2015-06-15 NOTE — Telephone Encounter (Signed)
Done

## 2015-06-16 ENCOUNTER — Telehealth: Payer: Self-pay

## 2015-06-16 NOTE — Telephone Encounter (Signed)
Patient wanted to let Benny Lennert and Urban Gibson know that she had had an appointment with her therapist on 06/16/15.

## 2015-06-16 NOTE — Telephone Encounter (Signed)
Forms completed and faxed back to [employer] on 06/16/15.

## 2015-06-17 NOTE — Telephone Encounter (Signed)
Great!

## 2015-06-28 ENCOUNTER — Ambulatory Visit (INDEPENDENT_AMBULATORY_CARE_PROVIDER_SITE_OTHER): Payer: Managed Care, Other (non HMO) | Admitting: Physician Assistant

## 2015-06-28 VITALS — BP 140/80 | HR 72 | Temp 99.1°F | Resp 16 | Ht 62.0 in | Wt 204.4 lb

## 2015-06-28 DIAGNOSIS — N926 Irregular menstruation, unspecified: Secondary | ICD-10-CM

## 2015-06-28 DIAGNOSIS — Z3041 Encounter for surveillance of contraceptive pills: Secondary | ICD-10-CM | POA: Diagnosis not present

## 2015-06-28 DIAGNOSIS — N898 Other specified noninflammatory disorders of vagina: Secondary | ICD-10-CM | POA: Diagnosis not present

## 2015-06-28 DIAGNOSIS — L298 Other pruritus: Secondary | ICD-10-CM | POA: Diagnosis not present

## 2015-06-28 DIAGNOSIS — Z566 Other physical and mental strain related to work: Secondary | ICD-10-CM

## 2015-06-28 LAB — POCT URINALYSIS DIPSTICK
BILIRUBIN UA: NEGATIVE
Blood, UA: NEGATIVE
GLUCOSE UA: NEGATIVE
Ketones, UA: NEGATIVE
Leukocytes, UA: NEGATIVE
NITRITE UA: NEGATIVE
Protein, UA: NEGATIVE
SPEC GRAV UA: 1.01
Urobilinogen, UA: 0.2
pH, UA: 5.5

## 2015-06-28 LAB — POCT WET PREP WITH KOH
KOH PREP POC: NEGATIVE
Trichomonas, UA: NEGATIVE
Yeast Wet Prep HPF POC: NEGATIVE

## 2015-06-28 LAB — POCT URINE PREGNANCY: PREG TEST UR: NEGATIVE

## 2015-06-28 MED ORDER — NORGESTIM-ETH ESTRAD TRIPHASIC 0.18/0.215/0.25 MG-25 MCG PO TABS: 1.0000 | ORAL_TABLET | Freq: Every day | ORAL | Status: DC

## 2015-06-28 NOTE — Progress Notes (Signed)
[facility] [address], [place] [address] [phone]  Date:  06/28/2015   Name:  Christina Hoffman   DOB:  1971/04/27   MRN:  [id number]  PCP:  Jackie Plum, MD    Chief Complaint: Gynecologic Exam   History of Present Illness:  This is a 44 y.o. female with PMH anxiety, depression, pelvic adhesions and pain, ovarian cyst who is presenting needing a pap smear. Last got 1 year ago with Dr. Antony Blackbird. Pt report she has had vaginal itching x 2 months. Intermittent discharge but not having any currently. Discharge was yellow and creamy. Hasn't tried anything for symptoms. Has never had STDs or BV. Has been dating female partner x 1 year. LMP unknown - on her birth control she sometimes does not have periods. Last period she thinks was 2 months ago. She thinks she is perimenopausal. Having some problems with new vaginal dryness. She has not tried coming off her birth control to see if she still has periods. She denies abdominal pain, n/v, urinary sx, fever or chills.  Pt was seen here 3 weeks ago with work stress and was prescribed xanax and prozac. Pt states she never picked up. She is actively looking for a new job and does not want to start medications.  Review of Systems:  Review of Systems See HPI  Patient Active Problem List   Diagnosis Date Noted  . Anxiety and depression 06/08/2015  . Work-related stress 06/08/2015  . Emotional stress 06/08/2015  . Pelvic pain in female 05/27/2014  . Pelvic peritoneal adhesions, female 05/27/2014  . Other and unspecified ovarian cyst 10/02/2013    Prior to Admission medications   Medication Sig Start Date End Date Taking? Authorizing Provider  Norgestimate-Ethinyl Estradiol Triphasic (ORTHO TRI-CYCLEN LO) 0.18/0.215/0.25 MG-25 MCG tab Take 1 tablet by mouth daily. 10/02/13  Yes Minta Balsam, MD                 No Known Allergies  Past Surgical History  Procedure Laterality Date  . Cesarean section    .  Cholecystectomy  23 years ago    had bile in abdomen after surgery  . Laparoscopy N/A 05/27/2014    Procedure: LAPAROSCOPY WITH  LYSIS OF ADHESIONS;  Surgeon: Lavina Hamman, MD;  Location: [facility];  Service: Gynecology;  Laterality: N/A;    History  Substance Use Topics  . Smoking status: Never Smoker   . Smokeless tobacco: Never Used  . Alcohol Use: No    History reviewed. No pertinent family history.  Medication list has been reviewed and updated.  Physical Examination:  Physical Exam  Constitutional: She is oriented to person, place, and time. She appears well-developed and well-nourished. No distress.  HENT:  Head: Normocephalic and atraumatic.  Right Ear: Hearing normal.  Left Ear: Hearing normal.  Nose: Nose normal.  Eyes: Conjunctivae and lids are normal. Right eye exhibits no discharge. Left eye exhibits no discharge. No scleral icterus.  Cardiovascular: Normal rate, regular rhythm and normal heart sounds.   No murmur heard. Pulmonary/Chest: Effort normal and breath sounds normal. No respiratory distress. She has no wheezes. She has no rhonchi. She has no rales.  Abdominal: Soft. Normal appearance. There is no tenderness.  Genitourinary: Uterus normal. There is no lesion on the right labia. There is no lesion on the left labia. Cervix exhibits no motion tenderness, no discharge and no friability. Right adnexum displays no tenderness and no fullness. Left adnexum displays no tenderness and no  fullness. Vaginal discharge (small amount, white) found.  Musky odor  Musculoskeletal: Normal range of motion.  Neurological: She is alert and oriented to person, place, and time.  Skin: Skin is warm, dry and intact. No lesion and no rash noted.  Psychiatric: She has a normal mood and affect. Her speech is normal and behavior is normal. Thought content normal.   BP 140/80 mmHg  Pulse 72  Temp(Src) 99.1 F (37.3 C) (Oral)  Resp 16  Ht 5\' 2"  (1.575 m)  Wt 204 lb 6.4 oz (92.715 kg)   BMI 37.38 kg/m2  SpO2 99%  LMP  (LMP Unknown)  Results for orders placed or performed in visit on 06/28/15  POCT Wet Prep with KOH  Result Value Ref Range   Trichomonas, UA Negative    Clue Cells Wet Prep HPF POC fe    Epithelial Wet Prep HPF POC Moderate Few, Moderate, Many   Yeast Wet Prep HPF POC neg    Bacteria Wet Prep HPF POC Many (A) None, Few   RBC Wet Prep HPF POC 0-1    WBC Wet Prep HPF POC 1-5    KOH Prep POC Negative   POCT urine pregnancy  Result Value Ref Range   Preg Test, Ur Negative Negative   2-3 clues, 10-15 epis  Assessment and Plan:  1. Vaginal itching 2. Vaginal discharge 3. Irregular periods Wet prep with only 20% clues, otherwise negative. G/C pending. Will await G/C results before treatment. IF negative, will treat for BV d/t odor and clue cells on wet prep. She states she would prefer treatment with cream instead of oral abx. Counseled on avoiding scented products in genital region. UA negative and neg for hcg. Return if not getting better in 10-14 days. - POCT Wet Prep with KOH - POCT urinalysis dipstick - POCT Wet Prep with KOH - GC/Chlamydia Probe Amp - POCT urinalysis dipstick - POCT urine pregnancy - POCT urinalysis dipstick  4. Surveillance for birth control, oral contraceptives Pt likely perimenopausal. She does not want to come off OCPs right now but wants to soon. I suggested when timing is right, she should come off OCP and see if she still has periods. She will need to use back up [facility] while off OCPs. OCP refilled. - Norgestimate-Ethinyl Estradiol Triphasic (ORTHO TRI-CYCLEN LO) 0.18/0.215/0.25 MG-25 MCG tab; Take 1 tablet by mouth daily.  Dispense: 3 Package; Refill: 3 - POCT urinalysis dipstick  5. Work-related stress Pt states she is still experiencing work related stress but does not want to start medications for anxiety/mood at this time. She is actively looking for a new job and thinks her stress will reduce dramatically with a job  change.   [place] Miners Dyke Brackett, MHS [facility]  06/28/2015

## 2015-06-28 NOTE — Patient Instructions (Signed)
I will call you with the results of your lab tests. If everything negative, I may treat your for possible bacterial vaginosis. Try coming off your birth control and see if you have a period. Avoid scented products. Return if symptoms not improving in 2 weeks.

## 2015-06-29 LAB — GC/CHLAMYDIA PROBE AMP
CT Probe RNA: NEGATIVE
GC PROBE AMP APTIMA: NEGATIVE

## 2015-07-01 ENCOUNTER — Telehealth: Payer: Self-pay

## 2015-07-01 DIAGNOSIS — N760 Acute vaginitis: Principal | ICD-10-CM

## 2015-07-01 DIAGNOSIS — B9689 Other specified bacterial agents as the cause of diseases classified elsewhere: Secondary | ICD-10-CM

## 2015-07-01 MED ORDER — METRONIDAZOLE 0.75 % VA GEL: 1.0000 | Freq: Two times a day (BID) | VAGINAL | Status: DC

## 2015-07-01 NOTE — Telephone Encounter (Signed)
Patient states she has been waiting for her lab results since Tuesday. She said she's left a voicemail and called several times because she has a presisting issue that needs to get taken care of. Please call! [phone]

## 2015-07-01 NOTE — Telephone Encounter (Signed)
Will treat for BV with metrogel. She will insert twice a day for 5 days. She should avoid alcohol while using this product. If symptoms not improving in 7-10 days, RTC.

## 2015-07-01 NOTE — Telephone Encounter (Signed)
Her gonorrhea and chlamydia are both neg

## 2015-07-01 NOTE — Telephone Encounter (Signed)
I told the patient her results are negative. Patient would like to know what to do next. She states it's okay to leave a voicemail if she doesn't get to the phone. [phone]

## 2015-07-01 NOTE — Progress Notes (Signed)
  Medical screening examination/treatment/procedure(s) were performed by non-physician practitioner and as supervising physician I was immediately available for consultation/collaboration.     

## 2015-07-01 NOTE — Addendum Note (Signed)
Addended by: Carmelina Dane on: 07/01/2015 09:25 AM   Modules accepted: Kipp Brood

## 2015-07-01 NOTE — Telephone Encounter (Signed)
Please review

## 2015-07-01 NOTE — Telephone Encounter (Signed)
Pt.notified

## 2015-07-04 ENCOUNTER — Ambulatory Visit (INDEPENDENT_AMBULATORY_CARE_PROVIDER_SITE_OTHER): Payer: Managed Care, Other (non HMO) | Admitting: Urgent Care

## 2015-07-04 ENCOUNTER — Telehealth: Payer: Self-pay

## 2015-07-04 VITALS — BP 128/84 | HR 73 | Temp 99.1°F | Resp 16 | Ht 61.0 in | Wt 202.0 lb

## 2015-07-04 DIAGNOSIS — L270 Generalized skin eruption due to drugs and medicaments taken internally: Secondary | ICD-10-CM

## 2015-07-04 DIAGNOSIS — Z3041 Encounter for surveillance of contraceptive pills: Secondary | ICD-10-CM | POA: Diagnosis not present

## 2015-07-04 NOTE — Patient Instructions (Signed)
Drug Rash Skin reactions can be caused by several different drugs. Allergy to the medicine can cause itching, hives, and other rashes. Sun exposure causes a red rash with some medicines. Mononucleosis virus can cause a similar red rash when you are taking antibiotics. Sometimes, the rash may be accompanied by pain. The drug rash may happen with new drugs or with medicines that you have been taking for a while. The rash cannot be spread from person to person. In most cases, the symptoms of a drug rash are gone within a few days of stopping the medicine. Your rash, including hives (urticaria), is most likely from the following medicines:  Antibiotics or antimicrobials.  Anticonvulsants or seizure medicines.  Antihypertensives or blood pressure medicines.  Antimalarials.  Antidepressants or depression medicines.  Antianxiety drugs.  Diuretics or water pills.  Nonsteroidal anti-inflammatory drugs.  Simvastatin.  Lithium.  Omeprazole.  Allopurinol.  Pseudoephedrine.  Amiodarone.  Packed red blood cells, when you get a blood transfusion.  Contrast media, such as when getting an imaging test (CT or CAT scan). This drug list is not all inclusive, but drug rashes have been reported with all the medicines listed above. Your caregiver will tell you which medicines to avoid. If you react to a medicine, a similar or worse reaction can occur the next time you take it. If you need to stop taking an antibiotic because of a drug rash, an alternative antibiotic may be needed to get rid of your infection. Antihistamine or cortisone drugs may be prescribed to help relieve your symptoms. Stay out of the sun until the rash is completely gone.  Be sure to let your caregiver know about your drug reaction. Do not take this medicine in the future. Call your caregiver if your drug rash does not improve within 3 to 4 days. SEEK IMMEDIATE MEDICAL CARE IF:   You develop breathing problems, swelling in the  throat, or wheezing.  You have weakness, fainting, fever, and muscle or joint pains.  You develop blisters or peeling of skin, especially around the mouth. Document Released: 12/20/2004 Document Revised: 03/29/2014 Document Reviewed: 09/30/2008 ExitCare Patient Information 2015 [employer]. This information is not intended to replace advice given to you by your health care provider. Make sure you discuss any questions you have with your health care provider.  

## 2015-07-04 NOTE — Progress Notes (Signed)
    MRN: [id number] DOB: [birth year]  Subjective:   Christina Hoffman is a 44 y.o. female presenting for chief complaint of Rash and rash itching  Reports onset of rash today. Rash is itchy, red spots over her abdomen. Started taking Flagyl for possible BV on 06/29/2015. At the time, she reports having minimal vaginal symptoms and was coming for routine gynecologic care. Denies fever, chest tightness, shob, n/v, abdominal pain, facial swelling, lip swelling, throat closing. Has never had drug reaction before. Denies any other aggravating or relieving factors, no other questions or concerns.  Christina Hoffman has a current medication list which includes the following prescription(s): norgestimate-ethinyl estradiol triphasic. She has No Known Allergies.  Christina Hoffman  has a past medical history of Polycystic ovarian syndrome; Complication of anesthesia (1991); and Allergy. Also  has past surgical history that includes Cesarean section; Cholecystectomy (23 years ago); and laparoscopy (N/A, 05/27/2014).  ROS As in subjective.  Objective:   Vitals: BP 128/84 mmHg  Pulse 73  Temp(Src) 99.1 F (37.3 C) (Oral)  Resp 16  Ht  (1.549 m)  Wt 202 lb (91.627 kg)  BMI 38.19 kg/m2  SpO2 97%  LMP 07/03/2015  Physical Exam  Constitutional: She is oriented to person, place, and time. She appears well-developed and well-nourished.  HENT:  Mouth/Throat: Oropharynx is clear and moist.  Cardiovascular: Exam reveals no gallop and no friction rub.   No murmur heard. Pulmonary/Chest: No respiratory distress. She has no wheezes. She has no rales.  Abdominal: Soft. Bowel sounds are normal. She exhibits no distension and no mass. There is no tenderness.  Neurological: She is alert and oriented to person, place, and time.  Skin: Skin is warm and dry. Rash (urticarial lesions over forearms bilaterally and torso) noted. No erythema. No pallor.    Assessment and Plan :   1. Drug rash - Discontinue Flagyl, start benadryl or Zyrtec  and Zantac. RTC if symptoms worsen as discussed in clinic, consider IM benadryl, steroid taper at that point. Patient would like to hold off on additional treatment for BV given that she did not have too many symptoms before starting the medication. I agreed.  2. Surveillance for birth control, oral contraceptives - Patient reports that she is going to continue OCP because she does not want to get pregnant and is currently sexually active with her bf.  Wallis Bamberg, PA-C [facility] [phone] 07/04/2015 12:48 PM

## 2015-07-04 NOTE — Telephone Encounter (Signed)
Pt is here in the office now.  

## 2015-07-04 NOTE — Telephone Encounter (Signed)
LMOM of negative labs

## 2015-07-04 NOTE — Telephone Encounter (Signed)
Pt states the medication she was given is causing her to have a rash on her stomach and it is spreading, advised pt to come in but she would prefer a call back Please call [phone]

## 2015-07-14 ENCOUNTER — Ambulatory Visit (INDEPENDENT_AMBULATORY_CARE_PROVIDER_SITE_OTHER): Payer: Managed Care, Other (non HMO) | Admitting: Physician Assistant

## 2015-07-14 VITALS — BP 114/73 | HR 90 | Temp 98.2°F | Resp 16 | Ht 61.0 in | Wt 202.0 lb

## 2015-07-14 DIAGNOSIS — Z566 Other physical and mental strain related to work: Secondary | ICD-10-CM | POA: Diagnosis not present

## 2015-07-14 DIAGNOSIS — R457 State of emotional shock and stress, unspecified: Secondary | ICD-10-CM
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
## 2015-07-14 NOTE — Patient Instructions (Signed)
Work on medication and deep breathing exercises. Exercise daily. Get good rest each night. Eat healthy. Avoid alcohol. Return in 2-3 weeks for follow up.

## 2015-07-14 NOTE — Progress Notes (Signed)
  Medical screening examination/treatment/procedure(s) were performed by non-physician practitioner and as supervising physician I was immediately available for consultation/collaboration.     

## 2015-07-15 ENCOUNTER — Telehealth: Payer: Self-pay

## 2015-07-15 NOTE — Telephone Encounter (Signed)
[employer] faxed in some forms to be completed by PA-Bush, I have filled out what I can and highlighted the areas that need to be completed. It is all dependent on the #2 question on the front of the form as to whether or not the whole package needs to be completed. I will place in Nicole's box on 07/15/15, please complete the forms and return to the [facility] box at the 102 check out desk within 5-7 business days.

## 2015-07-21 NOTE — Telephone Encounter (Signed)
Forms filled out and placed at [facility] box.

## 2015-12-13 ENCOUNTER — Ambulatory Visit (INDEPENDENT_AMBULATORY_CARE_PROVIDER_SITE_OTHER): Payer: BLUE CROSS/BLUE SHIELD | Admitting: Family Medicine

## 2015-12-13 VITALS — BP 112/80 | HR 86 | Temp 99.0°F | Resp 17 | Ht 62.0 in | Wt 213.0 lb

## 2015-12-13 DIAGNOSIS — J069 Acute upper respiratory infection, unspecified: Secondary | ICD-10-CM

## 2015-12-13 NOTE — Patient Instructions (Signed)
Upper Respiratory Infection, Adult Most upper respiratory infections (URIs) are a viral infection of the air passages leading to the lungs. A URI affects the nose, throat, and upper air passages. The most common type of URI is nasopharyngitis and is typically referred to as "the common cold." URIs run their course and usually go away on their own. Most of the time, a URI does not require medical attention, but sometimes a bacterial infection in the upper airways can follow a viral infection. This is called a secondary infection. Sinus and middle ear infections are common types of secondary upper respiratory infections. Bacterial pneumonia can also complicate a URI. A URI can worsen asthma and chronic obstructive pulmonary disease (COPD). Sometimes, these complications can require emergency medical care and may be life threatening.  CAUSES Almost all URIs are caused by viruses. A virus is a type of germ and can spread from one person to another.  RISKS FACTORS You may be at risk for a URI if:   You smoke.   You have chronic heart or lung disease.  You have a weakened defense (immune) system.   You are very young or very old.   You have nasal allergies or asthma.  You work in crowded or poorly ventilated areas.  You work in health care facilities or schools. SIGNS AND SYMPTOMS  Symptoms typically develop 2-3 days after you come in contact with a cold virus. Most viral URIs last 7-10 days. However, viral URIs from the influenza virus (flu virus) can last 14-18 days and are typically more severe. Symptoms may include:   Runny or stuffy (congested) nose.   Sneezing.   Cough.   Sore throat.   Headache.   Fatigue.   Fever.   Loss of appetite.   Pain in your forehead, behind your eyes, and over your cheekbones (sinus pain).  Muscle aches.  DIAGNOSIS  Your health care provider may diagnose a URI by:  Physical exam.  Tests to check that your symptoms are not due to  another condition such as:  Strep throat.  Sinusitis.  Pneumonia.  Asthma. TREATMENT  A URI goes away on its own with time. It cannot be cured with medicines, but medicines may be prescribed or recommended to relieve symptoms. Medicines may help:  Reduce your fever.  Reduce your cough.  Relieve nasal congestion. HOME CARE INSTRUCTIONS   Take medicines only as directed by your health care provider.   Gargle warm saltwater or take cough drops to comfort your throat as directed by your health care provider.  Use a warm mist humidifier or inhale steam from a shower to increase air moisture. This may make it easier to breathe.  Drink enough fluid to keep your urine clear or pale yellow.   Eat soups and other clear broths and maintain good nutrition.   Rest as needed.   Return to work when your temperature has returned to normal or as your health care provider advises. You may need to stay home longer to avoid infecting others. You can also use a face mask and careful hand washing to prevent spread of the virus.  Increase the usage of your inhaler if you have asthma.   Do not use any tobacco products, including cigarettes, chewing tobacco, or electronic cigarettes. If you need help quitting, ask your health care provider. PREVENTION  The best way to protect yourself from getting a cold is to practice good hygiene.   Avoid oral or hand contact with people with cold   symptoms.   Wash your hands often if contact occurs.  There is no clear evidence that vitamin C, vitamin E, echinacea, or exercise reduces the chance of developing a cold. However, it is always recommended to get plenty of rest, exercise, and practice good nutrition.  SEEK MEDICAL CARE IF:   You are getting worse rather than better.   Your symptoms are not controlled by medicine.   You have chills.  You have worsening shortness of breath.  You have brown or red mucus.  You have yellow or brown nasal  discharge.  You have pain in your face, especially when you bend forward.  You have a fever.  You have swollen neck glands.  You have pain while swallowing.  You have white areas in the back of your throat. SEEK IMMEDIATE MEDICAL CARE IF:   You have severe or persistent:  Headache.  Ear pain.  Sinus pain.  Chest pain.  You have chronic lung disease and any of the following:  Wheezing.  Prolonged cough.  Coughing up blood.  A change in your usual mucus.  You have a stiff neck.  You have changes in your:  Vision.  Hearing.  Thinking.  Mood. MAKE SURE YOU:   Understand these instructions.  Will watch your condition.  Will get help right away if you are not doing well or get worse.   This information is not intended to replace advice given to you by your health care provider. Make sure you discuss any questions you have with your health care provider.   Document Released: 05/08/2001 Document Revised: 03/29/2015 Document Reviewed: 02/17/2014 Elsevier Interactive Patient Education 2016 Elsevier Inc.  

## 2015-12-13 NOTE — Progress Notes (Signed)
[facility] [address], [place] [address] [phone]  Date:  12/13/2015   Name:  Christina Hoffman   DOB:  [birth year]   MRN:  [id number]  PCP:  Jackie Plum, MD    Chief Complaint: Sinus Congestion and Sore Throat   History of Present Illness:  Christina Hoffman is a 45 y.o. very pleasant female patient who presents with the following: Congestion:  - Began 5 days ago - Sore throat, Congestion.  - Post-nasal drip - No signficant cough. No hemoptysis.  - Reports subjective fevers, "hot flashes". No chills, dizziness.  - Has tried zyrtec, dayquil. No NSAIDs or APAP tried.  - Denies dyspnea.   - Transient earache chronically, but nothing bad recently.   - Works at a call center and feels  - Has never tried a netti pot or nasal saline.    Patient Active Problem List   Diagnosis Date Noted  . Drug rash 07/04/2015  . Anxiety and depression 06/08/2015  . Work-related stress 06/08/2015  . Emotional stress 06/08/2015  . Pelvic pain in female 05/27/2014  . Pelvic peritoneal adhesions, female 05/27/2014  . Other and unspecified ovarian cyst 10/02/2013    Past Medical History  Diagnosis Date  . Polycystic ovarian syndrome   . Complication of anesthesia 1991    woke up during procedure  . Allergy     Past Surgical History  Procedure Laterality Date  . Cesarean section    . Cholecystectomy  23 years ago    had bile in abdomen after surgery  . Laparoscopy N/A 05/27/2014    Procedure: LAPAROSCOPY WITH  LYSIS OF ADHESIONS;  Surgeon: Lavina Hamman, MD;  Location: [facility];  Service: Gynecology;  Laterality: N/A;    Social History  Substance Use Topics  . Smoking status: Never Smoker   . Smokeless tobacco: Never Used  . Alcohol Use: No    History reviewed. No pertinent family history.  Allergies  Allergen Reactions  . Metronidazole Hives    Medication list has been reviewed and updated.  Current Outpatient Prescriptions on File Prior to Visit   Medication Sig Dispense Refill  . Norgestimate-Ethinyl Estradiol Triphasic (ORTHO TRI-CYCLEN LO) 0.18/0.215/0.25 MG-25 MCG tab Take 1 tablet by mouth daily. 3 Package 3   No current facility-administered medications on file prior to visit.    Review of Systems:  As listed in HPI.    Physical Examination: Filed Vitals:   12/13/15 1720  BP: 112/80  Pulse: 86  Temp: 99 F (37.2 C)  Resp: 17   Filed Vitals:   12/13/15 1720  Height:  (1.575 m)  Weight: 213 lb (96.616 kg)   Body mass index is 38.95 kg/(m^2). Ideal Body Weight: Weight in (lb) to have BMI = 25: 136.4  GEN: WDWN, NAD, Non-toxic, A & O x 3 HEENT: Atraumatic, Normocephalic. Neck supple. No masses, No LAD. Clear rhinorrhea.  No sinus tenderness. 0-1+ symmetric tonsils.   Ears and Nose: No external deformity. + LR with both TMs.  CV: RRR, No M/G/R. No JVD. No thrill. No extra heart sounds. PULM: CTA B, no wheezes, crackles, rhonchi. No retractions. No resp. distress. No accessory muscle use. ABD: S, NT, ND, +BS. No rebound. No HSM. Obese EXTR: No c/c/e NEURO Normal gait.  PSYCH: Normally interactive. Conversant. Not depressed or anxious appearing.  Calm demeanor.   Assessment and Plan: Viral URI: 5 days of illness, relatively stable.  Discussed symptomatic care and our patient is in agreement with this  plan.  Recommended flonase +/- afrin x 3 days.  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 Discussed importance of addressing origin of congestion rather than post-nasal drip sensation.  We will give her a work note holding her out of work tomorrow considering she works at a call center. No cough.  Call with any questions and return with any worsening symptoms.    Signed Guinevere Scarlet, MD

## 2016-05-09 ENCOUNTER — Other Ambulatory Visit: Payer: Self-pay | Admitting: Obstetrics and Gynecology

## 2016-05-09 ENCOUNTER — Other Ambulatory Visit: Payer: Self-pay | Admitting: Family Medicine

## 2016-05-09 DIAGNOSIS — Z1231 Encounter for screening mammogram for malignant neoplasm of breast: Secondary | ICD-10-CM

## 2016-05-16 ENCOUNTER — Ambulatory Visit
Admission: RE | Admit: 2016-05-16 | Discharge: 2016-05-16 | Disposition: A | Discharge status: Home / Self Care | Payer: No Typology Code available for payment source | Source: Ambulatory Visit | Attending: Obstetrics and Gynecology | Admitting: Obstetrics and Gynecology

## 2016-05-16 DIAGNOSIS — Z1231 Encounter for screening mammogram for malignant neoplasm of breast: Secondary | ICD-10-CM

## 2017-03-06 ENCOUNTER — Ambulatory Visit (INDEPENDENT_AMBULATORY_CARE_PROVIDER_SITE_OTHER): Payer: BLUE CROSS/BLUE SHIELD | Admitting: Family Medicine

## 2017-03-06 ENCOUNTER — Encounter: Payer: Self-pay | Admitting: Family Medicine

## 2017-03-06 ENCOUNTER — Other Ambulatory Visit (HOSPITAL_COMMUNITY)
Admission: RE | Admit: 2017-03-06 | Discharge: 2017-03-06 | Disposition: A | Discharge status: Home / Self Care | Payer: BLUE CROSS/BLUE SHIELD | Source: Ambulatory Visit | Attending: Family Medicine | Admitting: Family Medicine

## 2017-03-06 ENCOUNTER — Encounter: Payer: Self-pay | Admitting: Surgical

## 2017-03-06 VITALS — BP 120/84 | HR 83 | Temp 98.7°F | Ht 62.0 in | Wt 214.4 lb

## 2017-03-06 DIAGNOSIS — E669 Obesity, unspecified: Secondary | ICD-10-CM

## 2017-03-06 DIAGNOSIS — Z202 Contact with and (suspected) exposure to infections with a predominantly sexual mode of transmission: Secondary | ICD-10-CM

## 2017-03-06 DIAGNOSIS — F339 Major depressive disorder, recurrent, unspecified: Secondary | ICD-10-CM

## 2017-03-06 DIAGNOSIS — E282 Polycystic ovarian syndrome: Secondary | ICD-10-CM | POA: Diagnosis not present

## 2017-03-06 DIAGNOSIS — J301 Allergic rhinitis due to pollen: Secondary | ICD-10-CM | POA: Diagnosis not present

## 2017-03-06 MED ORDER — SERTRALINE HCL 50 MG PO TABS: 50.0000 mg | ORAL_TABLET | Freq: Every day | ORAL | 3 refills | Status: DC

## 2017-03-06 NOTE — Progress Notes (Signed)
Pre visit review using our clinic review tool, if applicable. No additional management support is needed unless otherwise documented below in the visit note. 

## 2017-03-06 NOTE — Progress Notes (Signed)
Christina Hoffman is a 46 y.o. female is here to [facility].   History of Present Illness:   Christina Hoffman CMA acting as scribe for Dr. Earlene Plater.  CC: Patient is coming in today to Establish care.  She is wanting to discuss medication for anxiety and depression. She is wanting to be checked for STD today as well.   Depression       The patient presents with depression.  This is a recurrent problem.  The onset quality is gradual.   The problem occurs daily.  Associated symptoms include fatigue, hopelessness and sad.  Associated symptoms include no headaches and no suicidal ideas.     The symptoms are aggravated by work stress.  Past treatments include psychotherapy.  Compliance with treatment is good.  Previous treatment provided mild relief.  Risk factors: the patient feels bullied at work.   Past medical history includes depression.   Exposure to STD   The patient's pertinent negatives include no discharge, dysuria, genital itching, genital rash or pelvic pain. Pertinent negatives include no abdominal pain, anorexia, fever, genital odor, rectal pain, sore throat or urinary frequency.    Health Maintenance Due  Topic Date Due  . TETANUS/TDAP  05/26/1988  . PAP SMEAR  05/26/1990   PMHx, SurgHx, SocialHx, Medications, and Allergies were reviewed in the Visit Navigator and updated as appropriate.   Past Medical History:  Diagnosis Date  . Anxiety   . Complication of anesthesia 1991   woke up during procedure  . Depression   . Depression, recurrent (HCC) 03/10/2017  . Obesity (BMI 30-39.9) 03/10/2017  . Pelvic peritoneal adhesions, female 05/27/2014  . Polycystic ovarian syndrome   . Seasonal allergies    Past Surgical History:  Procedure Laterality Date  . CESAREAN SECTION    . CHOLECYSTECTOMY  23 years ago  . LAPAROSCOPY N/A 05/27/2014    Family History  Problem Relation Age of Onset  . Hyperlipidemia Mother    Social History  Substance Use Topics  . Smoking status:  Never Smoker  . Smokeless tobacco: Never Used  . Alcohol use No   Current Medications and Allergies:   .  None   Allergies  Allergen Reactions  . Metronidazole Hives   Review of Systems:   Review of Systems  Constitutional: Positive for fatigue. Negative for chills, fever and malaise/fatigue.  HENT: Negative for congestion, ear pain, sinus pain and sore throat.   Eyes: Negative for blurred vision and double vision.  Respiratory: Negative for cough, shortness of breath and wheezing.   Cardiovascular: Negative for palpitations and leg swelling.  Gastrointestinal: Negative for abdominal pain, anorexia, constipation, diarrhea, nausea, rectal pain and vomiting.  Genitourinary: Negative for dysuria, frequency and pelvic pain.  Musculoskeletal: Negative for back pain, joint pain and neck pain.  Skin: Negative for rash.  Neurological: Negative for dizziness and headaches.  Psychiatric/Behavioral: Positive for depression. Negative for hallucinations, memory loss, substance abuse and suicidal ideas. The patient is nervous/anxious.     Vitals:   Vitals:   03/06/17 1429  BP: 120/84  Pulse: 83  Temp: 98.7 F (37.1 C)  TempSrc: Oral  SpO2: 96%  Weight: 214 lb 6.4 oz (97.3 kg)  Height:  (1.575 m)     Body mass index is 39.21 kg/m.  Physical Exam:   Physical Exam  Constitutional: She appears well-nourished.  HENT:  Head: Normocephalic and atraumatic.  Eyes: EOM are normal. Pupils are equal, round, and reactive to light.  Neck: Normal range  of motion. Neck supple.  Cardiovascular: Normal rate, regular rhythm, normal heart sounds and intact distal pulses.   Pulmonary/Chest: Effort normal.  Abdominal: Soft.  Skin: Skin is warm.  Psychiatric: She has a normal mood and affect. Her behavior is normal.  Nursing note and vitals reviewed.  Results for orders placed or performed in visit on 03/06/17  RPR  Result Value Ref Range   RPR Ser Ql NON REAC NON REAC  HIV antibody    Result Value Ref Range   HIV 1&2 Ab, 4th Generation NONREACTIVE NONREACTIVE  Urine cytology ancillary only  Result Value Ref Range   Chlamydia Negative    Neisseria gonorrhea Negative    Trichomonas Negative     Assessment and Plan:    Christina Hoffman was seen today for establish care and depression.  Diagnoses and all orders for this visit:  Depression, recurrent (HCC) Comments: Patient was given information on SSRIs and possible side effects were reviewed. She was asked to contact us with any worsening in symptoms or suicidal thoughts and we discussed that it would take 2-4 weeks to begin to see improvement in her symptoms.  Orders: -     sertraline (ZOLOFT) 50 MG tablet; Take 1 tablet (50 mg total) by mouth daily.  STD exposure -     RPR -     HIV antibody -     Urine cytology ancillary only  Obesity (BMI 30-39.9) Comments: The patient is asked to make an attempt to improve diet and exercise patterns to aid in medical management of this problem.    . Reviewed expectations re: course of current medical issues. . Discussed self-management of symptoms. . Outlined signs and symptoms indicating need for more acute intervention. . Patient verbalized understanding and all questions were answered. . See orders for this visit as documented in the electronic medical record. . Patient received an After Visit Summary.  Records requested if needed. I spent 30 minutes with this patient, greater than 50% was face-to-face time counseling regarding the above diagnoses.  CMA served as Neurosurgeon during this visit. History, Physical, and Plan performed by medical provider. Documentation and orders reviewed and attested to. Helane Rima, D.O.  Helane Rima, D.O. [place], [facility] 03/10/2017   Follow-up: 3 months.  Meds ordered this encounter  Medications  . sertraline (ZOLOFT) 50 MG tablet    Sig: Take 1 tablet (50 mg total) by mouth daily.    Dispense:  30 tablet    Refill:  3    Medications Discontinued During This Encounter  Medication Reason  . Norgestimate-Ethinyl Estradiol Triphasic (ORTHO TRI-CYCLEN LO) 0.18/0.215/0.25 MG-25 MCG tab Error   Orders Placed This Encounter  Procedures  . RPR  . HIV antibody

## 2017-03-07 LAB — RPR

## 2017-03-07 LAB — HIV ANTIBODY (ROUTINE TESTING W REFLEX): HIV 1&2 Ab, 4th Generation: NONREACTIVE

## 2017-03-08 LAB — URINE CYTOLOGY ANCILLARY ONLY
Chlamydia: NEGATIVE
Neisseria Gonorrhea: NEGATIVE
Trichomonas: NEGATIVE

## 2017-03-10 ENCOUNTER — Encounter: Payer: Self-pay | Admitting: Family Medicine

## 2017-03-10 DIAGNOSIS — E669 Obesity, unspecified: Secondary | ICD-10-CM

## 2017-03-10 DIAGNOSIS — J302 Other seasonal allergic rhinitis: Secondary | ICD-10-CM | POA: Insufficient documentation

## 2017-03-10 DIAGNOSIS — E282 Polycystic ovarian syndrome: Secondary | ICD-10-CM | POA: Insufficient documentation

## 2017-03-10 DIAGNOSIS — F339 Major depressive disorder, recurrent, unspecified: Secondary | ICD-10-CM

## 2017-03-10 HISTORY — DX: Obesity, unspecified: E66.9

## 2017-03-10 HISTORY — DX: Major depressive disorder, recurrent, unspecified: F33.9

## 2017-03-11 ENCOUNTER — Encounter: Payer: Self-pay | Admitting: Family Medicine

## 2017-03-11 ENCOUNTER — Ambulatory Visit (INDEPENDENT_AMBULATORY_CARE_PROVIDER_SITE_OTHER): Payer: BLUE CROSS/BLUE SHIELD | Admitting: Family Medicine

## 2017-03-11 ENCOUNTER — Encounter: Payer: Self-pay | Admitting: Surgical

## 2017-03-11 VITALS — BP 136/84 | HR 85 | Temp 98.4°F | Wt 212.4 lb

## 2017-03-11 DIAGNOSIS — F410 Panic disorder [episodic paroxysmal anxiety] without agoraphobia: Secondary | ICD-10-CM | POA: Diagnosis not present

## 2017-03-11 DIAGNOSIS — F339 Major depressive disorder, recurrent, unspecified: Secondary | ICD-10-CM | POA: Diagnosis not present

## 2017-03-11 DIAGNOSIS — F411 Generalized anxiety disorder: Secondary | ICD-10-CM

## 2017-03-11 DIAGNOSIS — Z566 Other physical and mental strain related to work: Secondary | ICD-10-CM

## 2017-03-11 MED ORDER — ALPRAZOLAM 0.25 MG PO TABS: 0.2500 mg | ORAL_TABLET | Freq: Two times a day (BID) | ORAL | 0 refills | Status: DC | PRN

## 2017-03-11 NOTE — Progress Notes (Signed)
Pre visit review using our clinic review tool, if applicable. No additional management support is needed unless otherwise documented below in the visit note. 

## 2017-03-11 NOTE — Progress Notes (Signed)
Christina Hoffman is a 46 y.o. female is here to discuss:  History of Present Illness:   Christina Hoffman, CMA, scribe for Dr. Earlene Hoffman.  Chief Complaint  Patient presents with  . Depression    Patient had a panic attack on Friday after work.   Anxiety  Presents for follow-up visit. Symptoms include depressed mood, excessive worry, hyperventilation, insomnia, irritability, muscle tension, nervous/anxious behavior and panic. Patient reports no chest pain, dizziness, palpitations, shortness of breath or suicidal ideas. Episode frequency: major panic attack after work. The severity of symptoms is moderate.     Health Maintenance Due  Topic Date Due  . TETANUS/TDAP  05/26/1988  . PAP SMEAR  05/26/1990   PMHx, SurgHx, SocialHx, FamHx, Medications, and Allergies were reviewed in the Visit Navigator and updated as appropriate.   Patient Active Problem List   Diagnosis Date Noted  . Obesity (BMI 30-39.9) 03/10/2017  . Depression, recurrent (HCC) 03/10/2017  . Seasonal allergies   . Polycystic ovarian syndrome    Social History  Substance Use Topics  . Smoking status: Never Smoker  . Smokeless tobacco: Never Used  . Alcohol use No   Current Medications and Allergies:   .  sertraline (ZOLOFT) 50 MG tablet, Take 1 tablet (50 mg total) by mouth daily., Disp: 30 tablet, Rfl: 3  Allergies  Allergen Reactions  . Metronidazole Hives   Review of Systems   Review of Systems  Constitutional: Positive for irritability. Negative for chills, fever and malaise/fatigue.  HENT: Negative for congestion, ear pain, sinus pain and sore throat.   Eyes: Negative for blurred vision and double vision.  Respiratory: Negative for cough, shortness of breath and wheezing.   Cardiovascular: Negative for chest pain, palpitations and leg swelling.  Gastrointestinal: Negative for abdominal pain, constipation, diarrhea and vomiting.  Genitourinary: Negative for dysuria.  Musculoskeletal: Negative for  back pain, joint pain and neck pain.  Skin: Negative for itching and rash.  Neurological: Positive for headaches. Negative for dizziness.  Psychiatric/Behavioral: Positive for depression. Negative for hallucinations, memory loss and suicidal ideas. The patient is nervous/anxious and has insomnia.     Vitals:   Vitals:   03/11/17 1000  BP: 136/84  Pulse: 85  Temp: 98.4 F (36.9 C)  TempSrc: Oral  SpO2: 97%  Weight: 212 lb 6.4 oz (96.3 kg)     Body mass index is 38.85 kg/m.   Physical Exam:    Physical Exam  Constitutional: She appears well-developed and well-nourished.  HENT:  Head: Normocephalic and atraumatic.  Eyes: EOM are normal. Pupils are equal, round, and reactive to light.  Neck: Normal range of motion. Neck supple.  Cardiovascular: Normal rate, regular rhythm and normal heart sounds.   Pulmonary/Chest: Effort normal.  Abdominal: Soft.  Skin: Skin is warm.  Psychiatric: She has a normal mood and affect. Her behavior is normal.  Nursing note and vitals reviewed.    Assessment and Plan:    Christina Hoffman was seen today for depression.  Diagnoses and all orders for this visit:  Depression, recurrent (HCC) Comments: Patient started Zoloft after last visit. It has only been one week. She is tolerating the medication without side-effects. No SI/HI.   Generalized anxiety disorder with panic attacks Comments: Triggers: Work. I provided the number for Christina Hoffman and encouraged the patient to call for therapy. Orders: -     ALPRAZolam (XANAX) 0.25 MG tablet; Take 1 tablet (0.25 mg total) by mouth 2 (two) times daily as needed for anxiety.  Stressful  workplace Comments: Workplace bullying. The patient is not financially able to leave. She is working on finding a new job.    . Reviewed expectations re: course of current medical issues. . Discussed self-management of symptoms. . Outlined signs and symptoms indicating need for more acute intervention. . Patient  verbalized understanding and all questions were answered. . See orders for this visit as documented in the electronic medical record. . Patient received an After Visit Summary.   CMA served as Neurosurgeon during this visit. History, Physical, and Plan performed by medical provider. Documentation and orders reviewed and attested to. Christina Hoffman, D.O.  Christina Hoffman, D.O. [place], [facility] 03/15/2017  Follow-up: Return in about 4 weeks (around 04/08/2017).

## 2017-04-09 ENCOUNTER — Encounter: Payer: Self-pay | Admitting: Family Medicine

## 2017-08-30 ENCOUNTER — Ambulatory Visit (INDEPENDENT_AMBULATORY_CARE_PROVIDER_SITE_OTHER): Payer: BLUE CROSS/BLUE SHIELD | Admitting: Family Medicine

## 2017-08-30 ENCOUNTER — Other Ambulatory Visit: Payer: Self-pay

## 2017-08-30 ENCOUNTER — Encounter: Payer: Self-pay | Admitting: Family Medicine

## 2017-08-30 VITALS — BP 130/78 | HR 89 | Temp 98.5°F | Ht 62.0 in | Wt 219.8 lb

## 2017-08-30 DIAGNOSIS — E669 Obesity, unspecified: Secondary | ICD-10-CM | POA: Diagnosis not present

## 2017-08-30 DIAGNOSIS — E282 Polycystic ovarian syndrome: Secondary | ICD-10-CM

## 2017-08-30 DIAGNOSIS — R05 Cough: Secondary | ICD-10-CM

## 2017-08-30 DIAGNOSIS — R059 Cough, unspecified: Secondary | ICD-10-CM

## 2017-08-30 MED ORDER — BENZONATATE 100 MG PO CAPS: 100.0000 mg | ORAL_CAPSULE | Freq: Three times a day (TID) | ORAL | 0 refills | Status: DC | PRN

## 2017-08-30 MED ORDER — ALBUTEROL SULFATE HFA 108 (90 BASE) MCG/ACT IN AERS: 2.0000 | INHALATION_SPRAY | Freq: Four times a day (QID) | RESPIRATORY_TRACT | 0 refills | Status: DC | PRN

## 2017-08-30 MED ORDER — PHENTERMINE HCL 37.5 MG PO TABS: 37.5000 mg | ORAL_TABLET | Freq: Every day | ORAL | 2 refills | Status: DC

## 2017-08-30 MED ORDER — AZITHROMYCIN 250 MG PO TABS: ORAL_TABLET | ORAL | 0 refills | Status: DC

## 2017-08-30 MED ORDER — ALBUTEROL SULFATE HFA 108 (90 BASE) MCG/ACT IN AERS: 2.0000 | INHALATION_SPRAY | Freq: Four times a day (QID) | RESPIRATORY_TRACT | 1 refills | Status: DC | PRN

## 2017-08-30 NOTE — Progress Notes (Signed)
Christina Hoffman is a 46 y.o. female here for an acute visit.  History of Present Illness:   Insurance claims handler, CMA, acting as scribe for Dr. Earlene Plater.  HPI:   1. Cough.  Patient complains of chills, nasal congestion and productive cough. Symptoms began 3 weeks ago. Symptoms have been gradually worsening since that time.The cough is productive of green sputum and is aggravated by reclining position.    2. Obesity (BMI 30-39.9).   Usual eating pattern includes: The patient eats a regular, healthy diet. The patient snacks frequently..     Usual physical activity includes no regular exercise.  Current life stressors: employment concern.  Wt Readings from Last 3 Encounters:  08/30/17 219 lb 12.8 oz (99.7 kg)  03/11/17 212 lb 6.4 oz (96.3 kg)  03/06/17 214 lb 6.4 oz (97.3 kg)     3. Polycystic ovarian syndrome. On OCPs. Doing well. Nonsmoker. No personal or family history of estrogen related cancers.     PMHx, SurgHx, SocialHx, Medications, and Allergies were reviewed in the Visit Navigator and updated as appropriate.  Current Medications:   .  sertraline (ZOLOFT) 50 MG tablet, Take 1 tablet (50 mg total) by mouth daily., Disp: 30 tablet, Rfl: 3  Allergies  Allergen Reactions  . Metronidazole Hives   Review of Systems:   Pertinent items are noted in the HPI. Otherwise, ROS is negative.  Vitals:   Vitals:   08/30/17 0746  BP: 130/78  Pulse: 89  Temp: 98.5 F (36.9 C)  TempSrc: Oral  SpO2: 95%  Weight: 219 lb 12.8 oz (99.7 kg)  Height:  (1.575 m)     Body mass index is 40.2 kg/m.   Physical Exam:   Physical Exam  Constitutional: She appears well-nourished.  HENT:  Head: Normocephalic and atraumatic.  Eyes: Pupils are equal, round, and reactive to light. EOM are normal.  Neck: Normal range of motion. Neck supple.  Cardiovascular: Normal rate, regular rhythm, normal heart sounds and intact distal pulses.   Pulmonary/Chest: Effort normal. She has  rhonchi.  Abdominal: Soft.  Skin: Skin is warm.  Psychiatric: She has a normal mood and affect. Her behavior is normal.  Nursing note and vitals reviewed.   Assessment and Plan:   Christina Hoffman was seen today for acute visit.  Diagnoses and all orders for this visit:  Cough Comments: Concern for bronchoPNA. Treatment as below. Red flags reviewed. Orders: -     azithromycin (ZITHROMAX) 250 MG tablet; Take 2 tablets on the first day, then 1 tablet daily until gone -     benzonatate (TESSALON PERLES) 100 MG capsule; Take 1 capsule (100 mg total) by mouth 3 (three) times daily as needed for cough. -     albuterol (PROVENTIL HFA;VENTOLIN HFA) 108 (90 Base) MCG/ACT inhaler; Inhale 2 puffs into the lungs every 6 (six) hours as needed for wheezing or shortness of breath.  Obesity (BMI 30-39.9) Comments: The patient is asked to make an attempt to improve diet and exercise patterns to aid in medical management of this problem.  Orders: -     phentermine (ADIPEX-P) 37.5 MG tablet; Take 1 tablet (37.5 mg total) by mouth daily before breakfast.  Polycystic ovarian syndrome Comments: Okay to continue with OCPs for now. I will refer to GYN in the next year to discuss change due to age/life stage.   . Reviewed expectations re: course of current medical issues. . Discussed self-management of symptoms. . Outlined signs and symptoms indicating need for more acute  intervention. . Patient verbalized understanding and all questions were answered. Marland Kitchen Health Maintenance issues including appropriate healthy diet, exercise, and smoking avoidance were discussed with patient. . See orders for this visit as documented in the electronic medical record. . Patient received an After Visit Summary.  CMA served as Neurosurgeon during this visit. History, Physical, and Plan performed by medical provider. The above documentation has been reviewed and is accurate and complete. Helane Rima, D.O.  Helane Rima, DO [place],  [facility] 08/30/2017

## 2017-09-03 ENCOUNTER — Other Ambulatory Visit: Payer: Self-pay

## 2017-09-03 MED ORDER — ALBUTEROL SULFATE HFA 108 (90 BASE) MCG/ACT IN AERS: 2.0000 | INHALATION_SPRAY | Freq: Four times a day (QID) | RESPIRATORY_TRACT | 0 refills | Status: DC | PRN

## 2017-09-27 ENCOUNTER — Encounter: Payer: Self-pay | Admitting: Family Medicine

## 2017-09-27 ENCOUNTER — Other Ambulatory Visit (HOSPITAL_COMMUNITY)
Admission: RE | Admit: 2017-09-27 | Discharge: 2017-09-27 | Disposition: A | Discharge status: Home / Self Care | Payer: BLUE CROSS/BLUE SHIELD | Source: Ambulatory Visit | Attending: Family Medicine | Admitting: Family Medicine

## 2017-09-27 ENCOUNTER — Ambulatory Visit (INDEPENDENT_AMBULATORY_CARE_PROVIDER_SITE_OTHER): Payer: BLUE CROSS/BLUE SHIELD | Admitting: Family Medicine

## 2017-09-27 VITALS — BP 106/68 | HR 95 | Temp 99.6°F | Ht 62.0 in | Wt 221.0 lb

## 2017-09-27 DIAGNOSIS — N898 Other specified noninflammatory disorders of vagina: Secondary | ICD-10-CM

## 2017-09-27 DIAGNOSIS — E282 Polycystic ovarian syndrome: Secondary | ICD-10-CM | POA: Diagnosis not present

## 2017-09-27 DIAGNOSIS — B349 Viral infection, unspecified: Secondary | ICD-10-CM

## 2017-09-27 MED ORDER — DOXYCYCLINE HYCLATE 100 MG PO TABS
100.0000 mg | ORAL_TABLET | Freq: Two times a day (BID) | ORAL | 0 refills | Status: DC
Start: 2017-09-27 — End: 2017-11-14

## 2017-09-27 MED ORDER — FLUCONAZOLE 150 MG PO TABS: 150.0000 mg | ORAL_TABLET | Freq: Once | ORAL | 0 refills | Status: DC

## 2017-09-27 MED ORDER — FLUCONAZOLE 150 MG PO TABS: ORAL_TABLET | ORAL | 0 refills | Status: DC

## 2017-09-27 MED ORDER — NORGESTIM-ETH ESTRAD TRIPHASIC 0.18/0.215/0.25 MG-25 MCG PO TABS: 1.0000 | ORAL_TABLET | Freq: Every day | ORAL | 11 refills | Status: DC

## 2017-09-27 NOTE — Progress Notes (Signed)
Christina Hoffman is a 46 y.o. female here for an acute visit.  History of Present Illness:   HPI:   1. Vaginal discharge.  Patient presents for evaluation of an abnormal vaginal discharge. Symptoms have been present for several days. Vaginal symptoms: burning, discharge described as malodorous and green, local irritation, odor, pain and vulvar itching. Contraception: condoms irregularly. She denies abnormal bleeding and blisters. Sexually transmitted infection risk: possible STD exposure. Menstrual flow: regular every 28-30 days.   2. PCOS (polycystic ovarian syndrome). Asks for Rx OCP (see previous note). Not prescribed at that time.     3. Nonspecific syndrome suggestive of viral illness.  Patient complains of sore throat. Associated symptoms include chills, myalgias, post nasal drip, sinus and nasal congestion and sore throat. Onset of symptoms was 2 days ago, and have been unchanged since that time. She is drinking plenty of fluids. She has not had recent close exposure to someone with proven streptococcal pharyngitis.    PMHx, SurgHx, SocialHx, Medications, and Allergies were reviewed in the Visit Navigator and updated as appropriate.  Current Medications:   .  albuterol (PROVENTIL HFA;VENTOLIN HFA) 108 (90 Base) MCG/ACT inhaler, Inhale 2 puffs into the lungs every 6 (six) hours as needed for wheezing or shortness of breath., Disp: 1 Inhaler, Rfl: 1 .  phentermine (ADIPEX-P) 37.5 MG tablet, Take 1 tablet (37.5 mg total) by mouth daily before breakfast., Disp: 30 tablet, Rfl: 2 .  sertraline (ZOLOFT) 50 MG tablet, Take 1 tablet (50 mg total) by mouth daily., Disp: 30 tablet, Rfl: 3  Allergies  Allergen Reactions  . Metronidazole Hives   Review of Systems:   Pertinent items are noted in the HPI. Otherwise, ROS is negative.  Vitals:   Vitals:   09/27/17 0733  BP: 106/68  Pulse: 95  Temp: 99.6 F (37.6 C)  TempSrc: Oral  SpO2: 98%  Weight: 221 lb (100.2 kg)  Height:  5\' 2"  (1.575 m)     Body mass index is 40.42 kg/m.   Physical Exam:   Physical Exam  Constitutional: She appears well-nourished.  HENT:  Head: Normocephalic and atraumatic.  Mouth/Throat: Posterior oropharyngeal erythema present.  Eyes: Pupils are equal, round, and reactive to light. EOM are normal.  Neck: Normal range of motion. Neck supple.  Cardiovascular: Normal rate, regular rhythm, normal heart sounds and intact distal pulses.   Pulmonary/Chest: Effort normal.  Abdominal: Soft.  Genitourinary:  Genitourinary Comments: Vulvar erythema, vaginal DC - green and thick. Patient very tender. Unable to use speculum.  Skin: Skin is warm.  Psychiatric: She has a normal mood and affect. Her behavior is normal.  Nursing note and vitals reviewed.   Assessment and Plan:   Aisa was seen today for acute visit.  Diagnoses and all orders for this visit:  Vaginal discharge Comments: Will empirically treat as below. Labs pending.  Orders: -     Cervicovaginal ancillary only -     doxycycline (VIBRA-TABS) 100 MG tablet; Take 1 tablet (100 mg total) by mouth 2 (two) times daily. -     fluconazole (DIFLUCAN) 150 MG tablet; Take one tablet then three days later take another.  PCOS (polycystic ovarian syndrome) -     Norgestimate-Ethinyl Estradiol Triphasic 0.18/0.215/0.25 MG-25 MCG tab; Take 1 tablet by mouth daily.  Nonspecific syndrome suggestive of viral illness Comments: Symptomatic care and red flags reviewed.   . Reviewed expectations re: course of current medical issues. . Discussed self-management of symptoms. . Outlined signs and symptoms indicating  need for more acute intervention. . Patient verbalized understanding and all questions were answered. Marland Kitchen. Health Maintenance issues including appropriate healthy diet, exercise, and smoking avoidance were discussed with patient. . See orders for this visit as documented in the electronic medical record. . Patient received an After  Visit Summary.  Helane RimaErica Josefa [place], DO [place], [facility] 09/27/2017

## 2017-09-30 LAB — CERVICOVAGINAL ANCILLARY ONLY
Bacterial vaginitis: NEGATIVE
Candida vaginitis: NEGATIVE
Chlamydia: NEGATIVE
Neisseria Gonorrhea: NEGATIVE
Trichomonas: NEGATIVE

## 2017-10-02 ENCOUNTER — Ambulatory Visit (INDEPENDENT_AMBULATORY_CARE_PROVIDER_SITE_OTHER): Payer: BLUE CROSS/BLUE SHIELD | Admitting: Family Medicine

## 2017-10-02 ENCOUNTER — Encounter: Payer: Self-pay | Admitting: Family Medicine

## 2017-10-02 VITALS — BP 116/72 | HR 83 | Temp 98.3°F | Wt 217.8 lb

## 2017-10-02 DIAGNOSIS — N761 Subacute and chronic vaginitis: Secondary | ICD-10-CM

## 2017-10-02 DIAGNOSIS — R300 Dysuria: Secondary | ICD-10-CM | POA: Diagnosis not present

## 2017-10-02 DIAGNOSIS — R112 Nausea with vomiting, unspecified: Secondary | ICD-10-CM | POA: Diagnosis not present

## 2017-10-02 LAB — POCT URINALYSIS DIPSTICK
Bilirubin, UA: NEGATIVE
Blood, UA: 200
Glucose, UA: NEGATIVE
Ketones, UA: NEGATIVE
Nitrite, UA: NEGATIVE
Protein, UA: 30
Spec Grav, UA: 1.025 (ref 1.010–1.025)
Urobilinogen, UA: 0.2 E.U./dL
pH, UA: 6 (ref 5.0–8.0)

## 2017-10-02 MED ORDER — FLUCONAZOLE 150 MG PO TABS: 150.0000 mg | ORAL_TABLET | Freq: Once | ORAL | 0 refills | Status: AC

## 2017-10-02 MED ORDER — ONDANSETRON 4 MG PO TBDP: 4.0000 mg | ORAL_TABLET | Freq: Three times a day (TID) | ORAL | 0 refills | Status: DC | PRN

## 2017-10-02 MED ORDER — CEFTRIAXONE SODIUM 1 G IJ SOLR
1.0000 g | Freq: Once | INTRAMUSCULAR | Status: AC
  Administered 2017-10-02: 1 g via INTRAMUSCULAR

## 2017-10-02 MED ORDER — NYSTATIN-TRIAMCINOLONE 100000-0.1 UNIT/GM-% EX OINT: 1.0000 "application " | TOPICAL_OINTMENT | Freq: Two times a day (BID) | CUTANEOUS | 0 refills | Status: DC

## 2017-10-02 NOTE — Progress Notes (Signed)
Christina Hoffman is a 46 y.o. female here for an acute visit.  History of Present Illness:   HPI: See feeling poorly. See previous note. Vaginitis with negative swabs. Treated with Doxy and Diflucan. She thinks that the Diflucan helped somewhat. No new exposures. Still with URI symptoms and urine leaking when she coughs. Causes burning. No new fevers.   PMHx, SurgHx, SocialHx, Medications, and Allergies were reviewed in the Visit Navigator and updated as appropriate.  Current Medications:   .  albuterol (PROVENTIL HFA;VENTOLIN HFA) 108 (90 Base) MCG/ACT inhaler, Inhale 2 puffs into the lungs every 6 (six) hours as needed for wheezing or shortness of breath., Disp: 1 Inhaler, Rfl: 1 .  albuterol (PROVENTIL HFA;VENTOLIN HFA) 108 (90 Base) MCG/ACT inhaler, Inhale 2 puffs into the lungs every 6 (six) hours as needed for wheezing or shortness of breath., Disp: 1 Inhaler, Rfl: 0 .  doxycycline (VIBRA-TABS) 100 MG tablet, Take 1 tablet (100 mg total) by mouth 2 (two) times daily., Disp: 20 tablet, Rfl: 0 .  fluconazole (DIFLUCAN) 150 MG tablet, Take one tablet then three days later take another., Disp: 2 tablet, Rfl: 0 .  Norgestimate-Ethinyl Estradiol Triphasic 0.18/0.215/0.25 MG-25 MCG tab, Take 1 tablet by mouth daily., Disp: 1 Package, Rfl: 11 .  phentermine (ADIPEX-P) 37.5 MG tablet, Take 1 tablet (37.5 mg total) by mouth daily before breakfast., Disp: 30 tablet, Rfl: 2 .  sertraline (ZOLOFT) 50 MG tablet, Take 1 tablet (50 mg total) by mouth daily., Disp: 30 tablet, Rfl: 3   Allergies  Allergen Reactions  . Metronidazole Hives   Review of Systems:   Pertinent items are noted in the HPI. Otherwise, ROS is negative.  Vitals:   Vitals:   10/02/17 0729  BP: 116/72  Pulse: 83  Temp: 98.3 F (36.8 C)  TempSrc: Oral  SpO2: 97%  Weight: 217 lb 12.8 oz (98.8 kg)     Body mass index is 39.84 kg/m.   Physical Exam:   Physical Exam  Constitutional: She appears well-nourished.   Uncomfortable-appearing in chair.   HENT:  Head: Normocephalic and atraumatic.  Eyes: EOM are normal. Pupils are equal, round, and reactive to light.  Neck: Normal range of motion. Neck supple.  Cardiovascular: Normal rate, regular rhythm, normal heart sounds and intact distal pulses.  Pulmonary/Chest: Effort normal.  Cough.  Abdominal: Soft.  Skin: Skin is warm.  Psychiatric: She has a normal mood and affect. Her behavior is normal.  Nursing note and vitals reviewed.  Results for orders placed or performed in visit on 10/02/17  POCT urinalysis dipstick  Result Value Ref Range   Color, UA Yellow    Clarity, UA Cloudy    Glucose, UA Negative    Bilirubin, UA Negative    Ketones, UA Negative    Spec Grav, UA 1.025 1.010 - 1.025   Blood, UA 200 Ery/uL    pH, UA 6.0 5.0 - 8.0   Protein, UA 30 mg/dL    Urobilinogen, UA 0.2 0.2 or 1.0 E.U./dL   Nitrite, UA Negative    Leukocytes, UA Large (3+) (A) Negative   Assessment and Plan:   Diagnoses and all orders for this visit:  Dysuria Comments: + LE. UCx pending. Rocephin in office today. Red flags reviewed.  Orders: -     POCT urinalysis dipstick -     Urine Culture -     cefTRIAXone (ROCEPHIN) injection 1 g  Subacute vaginitis Comments: Diflucan did seem to help, though negative swabs. See  treatment below. Possible allergic reaction. Red flags reviewed. If not improving, to GYN. Orders: -     Urine Culture -     fluconazole (DIFLUCAN) 150 MG tablet; Take 1 tablet (150 mg total) once for 1 dose by mouth. Once weekly x 3 weeks. -     nystatin-triamcinolone ointment (MYCOLOG); Apply 1 application 2 (two) times daily topically. -     cefTRIAXone (ROCEPHIN) injection 1 g  Non-intractable vomiting with nausea, unspecified vomiting type Comments: One episode of post-tussive emesis today. Zofran given in office. Rx provided for prn.  Orders: -     ondansetron (ZOFRAN ODT) 4 MG disintegrating tablet; Take 1 tablet (4 mg total)  every 8 (eight) hours as needed by mouth for nausea or vomiting.   . Reviewed expectations re: course of current medical issues. . Discussed self-management of symptoms. . Outlined signs and symptoms indicating need for more acute intervention. . Patient verbalized understanding and all questions were answered. Marland Kitchen. Health Maintenance issues including appropriate healthy diet, exercise, and smoking avoidance were discussed with patient. . See orders for this visit as documented in the electronic medical record. . Patient received an After Visit Summary.   Helane RimaErica Harold Mattes, DO [place], [facility] 10/02/2017

## 2017-10-03 LAB — URINE CULTURE
MICRO NUMBER:: 81252822
SPECIMEN QUALITY:: ADEQUATE

## 2017-10-04 ENCOUNTER — Other Ambulatory Visit: Payer: Self-pay | Admitting: Family Medicine

## 2017-10-04 DIAGNOSIS — N898 Other specified noninflammatory disorders of vagina: Secondary | ICD-10-CM

## 2017-10-30 ENCOUNTER — Other Ambulatory Visit: Payer: Self-pay | Admitting: Family Medicine

## 2017-10-30 DIAGNOSIS — R112 Nausea with vomiting, unspecified: Secondary | ICD-10-CM

## 2017-10-31 MED ORDER — ONDANSETRON 4 MG PO TBDP: 4.0000 mg | ORAL_TABLET | Freq: Three times a day (TID) | ORAL | 0 refills | Status: DC | PRN

## 2017-10-31 NOTE — Telephone Encounter (Signed)
Please advise on refill.

## 2017-11-14 ENCOUNTER — Ambulatory Visit (INDEPENDENT_AMBULATORY_CARE_PROVIDER_SITE_OTHER): Payer: BLUE CROSS/BLUE SHIELD | Admitting: Family Medicine

## 2017-11-14 ENCOUNTER — Encounter: Payer: Self-pay | Admitting: Family Medicine

## 2017-11-14 ENCOUNTER — Other Ambulatory Visit (HOSPITAL_COMMUNITY)
Admission: RE | Admit: 2017-11-14 | Discharge: 2017-11-14 | Disposition: A | Discharge status: Home / Self Care | Payer: BLUE CROSS/BLUE SHIELD | Source: Ambulatory Visit | Attending: Family Medicine | Admitting: Family Medicine

## 2017-11-14 VITALS — BP 120/72 | HR 88 | Temp 98.6°F | Ht 62.0 in | Wt 220.6 lb

## 2017-11-14 DIAGNOSIS — M542 Cervicalgia: Secondary | ICD-10-CM

## 2017-11-14 DIAGNOSIS — L509 Urticaria, unspecified: Secondary | ICD-10-CM | POA: Diagnosis not present

## 2017-11-14 DIAGNOSIS — Z113 Encounter for screening for infections with a predominantly sexual mode of transmission: Secondary | ICD-10-CM | POA: Insufficient documentation

## 2017-11-14 DIAGNOSIS — L739 Follicular disorder, unspecified: Secondary | ICD-10-CM

## 2017-11-14 DIAGNOSIS — N898 Other specified noninflammatory disorders of vagina: Secondary | ICD-10-CM | POA: Diagnosis not present

## 2017-11-14 DIAGNOSIS — M25511 Pain in right shoulder: Secondary | ICD-10-CM

## 2017-11-14 DIAGNOSIS — R300 Dysuria: Secondary | ICD-10-CM

## 2017-11-14 DIAGNOSIS — B373 Candidiasis of vulva and vagina: Secondary | ICD-10-CM | POA: Diagnosis not present

## 2017-11-14 LAB — POCT URINALYSIS DIPSTICK
Bilirubin, UA: NEGATIVE
GLUCOSE UA: NEGATIVE
KETONES UA: NEGATIVE
Nitrite, UA: NEGATIVE
Protein, UA: NEGATIVE
RBC UA: NEGATIVE
SPEC GRAV UA: 1.015 (ref 1.010–1.025)
Urobilinogen, UA: 0.2 E.U./dL
pH, UA: 6 (ref 5.0–8.0)

## 2017-11-14 MED ORDER — METHYLPREDNISOLONE ACETATE 80 MG/ML IJ SUSP
80.0000 mg | Freq: Once | INTRAMUSCULAR | Status: AC
  Administered 2017-11-14: 80 mg via INTRAMUSCULAR

## 2017-11-14 MED ORDER — FLUCONAZOLE 150 MG PO TABS: 150.0000 mg | ORAL_TABLET | ORAL | 0 refills | Status: DC

## 2017-11-14 MED ORDER — METHYLPREDNISOLONE ACETATE 40 MG/ML IJ SUSP
40.0000 mg | Freq: Once | INTRAMUSCULAR | Status: AC
  Administered 2017-11-14: 40 mg via INTRA_ARTICULAR

## 2017-11-14 MED ORDER — METHYLPREDNISOLONE ACETATE 40 MG/ML IJ SUSP: 40.0000 mg | Freq: Once | INTRAMUSCULAR | Status: DC

## 2017-11-14 NOTE — Progress Notes (Signed)
Subjective:  Christina Hoffman is a 46 y.o. female who presents today with a chief complaint of urticaria.   HPI:  Urticaria, acute issue Symptoms started 2-3 days ago.  Was initially located on her right leg and then progressed to her left leg about a day ago.  Lesions are very pruritic.  She has tried several over-the-counter medications including Claritin and hydrogen peroxide which helped a little bit.  Symptoms are worse at night.  No new obvious soaps, detergents, lotions, perfumes, or other known exposures.  She does have a history of stress hives however this does not seem similar to this.  No obvious alleviating or aggravating factors.  Dysuria, acute issue Symptoms started yesterday.  No fevers or chills.  No abdominal pain.  No back pain.  Vaginal discharge, acute issue Symptoms also started yesterday.  Of note patient was recently started on doxycycline for a finger infection about a week ago.  Discharge is characterized as white in nature.  As noted above, no fevers or chills.  No abdominal pain.  She tried Monistat which has not seemed to help significantly.  Right neck pain/right shoulder pain, acute issue Sudden onset this morning.  No obvious precipitating events.  Pain is worsened over the last few hours.  Pain seems to be mostly located to her right neck and right posterior shoulder.  No treatments tried.  She has decreased range of motion in her right shoulder secondary to pain.  No history of trauma.  No other obvious alleviating or aggravating factors.  STD screening, acute issue Patient request screening for all STDs today.  Aside from vaginal discharge noted above no other symptoms.  Skin lesion, acute issue Located on the left buttocks.  No obvious precipitating event.  No drainage or pain to the area.   ROS: Per HPI  PMH: Patient is a never smoker. Also with history of depression.  Objective:  Physical Exam: BP 120/72 (BP Location: Left Arm, Patient  Position: Sitting, Cuff Size: Normal)   Pulse 88   Temp 98.6 F (37 C) (Oral)   Ht 5\' 2"  (1.575 m)   Wt 220 lb 9.6 oz (100.1 kg)   SpO2 96%   BMI 40.35 kg/m   Gen: NAD, resting comfortably CV: RRR with no murmurs appreciated Pulm: NWOB, CTAB with no crackles, wheezes, or rhonchi GI: Normal bowel sounds present. Soft, Nontender, Nondistended. GU: Normal external female genitalia.  Clumpy white discharge noted in vaginal vault.  With no other lesions noted. MSK:  -Right shoulder: No deformities.  Tender to palpation over posterior aspect of shoulder.  Very limited range of motion secondary to pain.  Strength testing limited secondary to pain, however has intact supraspinatus, internal, and external rotation testing.  Unable to perform Neer or Hawkins test today.  Neurovascularly intact distally. -Neck: No deformities.  Nontender to palpation.  Spurling negative bilaterally. Skin: Small, punctate, palpable lesions to lower extremities bilaterally.  Also with well-healing excoriated papule to right buttocks. Neuro: Grossly normal, moves all extremities Psych: Normal affect and thought content  Results for orders placed or performed in visit on 11/14/17 (from the past 72 hour(s))  POCT urinalysis dipstick     Status: Abnormal   Collection Time: 11/14/17  8:54 AM  Result Value Ref Range   Color, UA Yellow    Clarity, UA Cloudy    Glucose, UA Negative    Bilirubin, UA Negative    Ketones, UA Negative    Spec Grav, UA 1.015 1.010 -  1.025   Blood, UA Negative    pH, UA 6.0 5.0 - 8.0   Protein, UA Negative    Urobilinogen, UA 0.2 0.2 or 1.0 E.U./dL   Nitrite, UA Negative    Leukocytes, UA Small (1+) (A) Negative   Appearance     Odor     Right shoulder Injection Procedure Note  Pre-operative Diagnosis: Shoulder pain  Post-operative Diagnosis: same  Indications: Pain Relief   Anesthesia: Topical ethyl chloride  Procedure Details   Written consent was obtained for the  procedure. The shoulder was prepped with iodine and the skin was anesthetized with topical ethyl choride. Using a 22 gauge needle the subacromial space was injected with 3 mL 1% lidocaine and 1 mL of 40cc/ml depomedrol under the posterior aspect of the acromion. The injection site was cleansed with topical isopropyl alcohol and a dressing was applied.  Complications:  None; patient tolerated the procedure well.   Assessment/Plan:  Urticaria Unclear etiology.  Likely allergic reaction.  Given widespread nature of lesions, we will give her systemic steroids.  Patient opted to have injection of IM Depo-Medrol today as opposed to a course of oral prednisone.  This was given.  Return precautions reviewed.  Follow-up as needed.  Dysuria/vaginal discharge Likely secondary to yeast vaginitis based on physical exam.  She does have positive leukocytes on her UA however this is again likely secondary to yeast vaginitis.  Will start Diflucan.  Will obtain urine culture prior to starting any antibiotics.  Neck pain/shoulder pain Concerned that patient may have early stages of adhesive capsulitis.  Shoulder injection was performed today-see above procedure note.  Discussed range of motion exercises with patient.  Also recommended ice as needed.  She does have some neck pain-however there is no physical exam this is not likely the main contributor to her shoulder pain, however would anticipate some improvement with the IM Depo-Medrol given for her urticaria.  Patient will follow-up in 1-2 weeks if not improving.  Folliculitis Lesion on buttocks consistent with folliculitis.  Reassured patient.  No further management needed at this time.  STD screening Check gonorrhea, chlamydia, trichomonas on vaginal swab.  Also check HIV antibody and RPR.  Time Spent: I spent 40 minutes face-to-face with the patient, with more than half spent on discussing diagnosis and treatment plan of her rash, shoulder pain, and  dysuria/vaginal discharge, and skin lesion.   Katina Degreealeb M. Jimmey RalphParker, MD 11/14/2017 9:01 AM

## 2017-11-14 NOTE — Patient Instructions (Addendum)
We will give you a shot of a steroid for the rash on her legs-this is likely an allergic reaction.  I think you likely have a yeast infection.  Start the Diflucan.  Take another dose in 3 days if your symptoms have not improved.  We will check blood work today.  Please continue working on range of motion for the shoulder.  Come back if not improving in the next couple of weeks.  Take care, Dr. Jimmey RalphParker

## 2017-11-14 NOTE — Addendum Note (Signed)
Addended by: Koleen DistanceAGNER, Nivin Braniff M on: 11/14/2017 12:43 PM   Modules accepted: Orders

## 2017-11-15 ENCOUNTER — Telehealth: Payer: Self-pay

## 2017-11-15 LAB — URINE CULTURE
MICRO NUMBER:: 81434119
Result:: NO GROWTH
SPECIMEN QUALITY:: ADEQUATE

## 2017-11-15 LAB — CERVICOVAGINAL ANCILLARY ONLY
BACTERIAL VAGINITIS: NEGATIVE
CANDIDA VAGINITIS: POSITIVE — AB
CHLAMYDIA, DNA PROBE: NEGATIVE
NEISSERIA GONORRHEA: NEGATIVE
TRICH (WINDOWPATH): NEGATIVE

## 2017-11-15 LAB — HIV ANTIBODY (ROUTINE TESTING W REFLEX): HIV: NONREACTIVE

## 2017-11-15 LAB — RPR: RPR: NONREACTIVE

## 2017-11-15 NOTE — Telephone Encounter (Signed)
Copied from CRM #[id number]. Topic: General - Other >> Nov 15, 2017 11:09 AM Latricia Cerrito, RMA wrote: Reason for CRM: pt stated that she had appt yesterday and would like to know what she can take for pain please advise and call pt back [phone]  

## 2017-11-15 NOTE — Telephone Encounter (Signed)
Please see note.

## 2017-11-15 NOTE — Telephone Encounter (Signed)
She can take ibuprofen 600mg  every 6-8 hours and/or tylenol 1000mg  every 8 hours for her pain.  The steroid injections should be reaching full effect within the next 24-36 hours so hopefully she won't need anything else.  Katina Degreealeb M. Jimmey RalphParker, MD 11/15/2017 4:52 PM

## 2017-11-15 NOTE — Telephone Encounter (Signed)
Dr. Jimmey RalphParker you seen this patient yesterday. Please advise on what she can take for pain.

## 2017-11-15 NOTE — Telephone Encounter (Signed)
Copied from CRM [phone]. Topic: General - Other >> Nov 15, 2017 11:09 AM Lelon FrohlichGolden, Tashia, RMA wrote: Reason for CRM: pt stated that she had appt yesterday and would like to know what she can take for pain please advise and call pt back [phone]

## 2017-11-18 NOTE — Telephone Encounter (Signed)
Notes recorded by Dierdre Searlesoleman, Brandy S, CMA on 11/18/2017 at 10:32 AM EST Called pt and left Vm to call the office.

## 2017-11-18 NOTE — Progress Notes (Signed)
Please let patient know that her swab confirmed a yeast infection. Her urine culture was negative - she does not need any further antibiotics at this time.  Her other tests were all negative.  Katina Degreealeb M. Jimmey RalphParker, MD 11/18/2017 8:21 AM

## 2017-11-20 NOTE — Telephone Encounter (Signed)
LM for patient to return call.

## 2017-11-21 ENCOUNTER — Other Ambulatory Visit: Payer: Self-pay

## 2017-11-21 ENCOUNTER — Encounter: Payer: Self-pay | Admitting: Family Medicine

## 2017-11-21 ENCOUNTER — Ambulatory Visit: Payer: Self-pay

## 2017-11-21 MED ORDER — DICLOFENAC SODIUM 75 MG PO TBEC: 75.0000 mg | DELAYED_RELEASE_TABLET | Freq: Two times a day (BID) | ORAL | 0 refills | Status: DC

## 2017-11-21 NOTE — Telephone Encounter (Signed)
We can send in diclofenac 75mg  bid #30.  Katina Degreealeb M. Jimmey RalphParker, MD 11/21/2017 1:11 PM

## 2017-11-21 NOTE — Telephone Encounter (Signed)
See note

## 2017-11-21 NOTE — Telephone Encounter (Signed)
Pt called iwith c/o of right shoulder pain. States had cortisone shot last Thursday and has swelling on Friday. Pt states she is taking Alieve only "but it is not working. States a aching pain 9/10. Pt c/o intermittent tingling to right hand. Pt also c/o hives that she stated she received a shot in the office for. She describes them as "stress hives". Pt at work and is calling to see if anything can be called in. Per telephone note 11/15/17 Dr Jimmey RalphParker wrote that pt can take Ibuprofen 600 mg q 6-8 hours prn and/or Tylenol 1000 mg q 8 hours prn. Pt has not been doing this. Attempted to reach Flow coordinator but unable. Reason for Disposition . [1] SEVERE pain AND [2] not improved 2 hours after pain medicine  Answer Assessment - Initial Assessment Questions 1. ONSET: "When did the pain start?"     Wednesday 2. LOCATION: "Where is the pain located?"     Right shoulder 3. PAIN: "How bad is the pain?" (Scale 1-10; or mild, moderate, severe)   - MILD (1-3): doesn't interfere with normal activities   - MODERATE (4-7): interferes with normal activities (e.g., work or school) or awakens from sleep   - SEVERE (8-10): excruciating pain, unable to do any normal activities, unable to move arm at all due to pain     9/10 4. 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 CAUSE: "What do you think is causing the shoulder pain?"     Pinched nerve in neck 6. OTHER SYMPTOMS: "Do you have any other symptoms?" (e.g., neck pain, swelling, rash, fever, numbness, weakness)     No neck pain. Is having swelling to shoulder. No fever. Intermittent tingling sensation to hand. Pt having hives. 7. PREGNANCY: "Is there any chance you are pregnant?" "When was your last menstrual period?"     No. LMP: in August  Protocols used: SHOULDER PAIN-A-AH

## 2017-11-21 NOTE — Telephone Encounter (Signed)
Please advise 

## 2017-11-21 NOTE — Telephone Encounter (Signed)
[facility] to speak to this patient and the patient hung up.  We attempted to get the patient back on the line and could not reach her.

## 2017-11-21 NOTE — Telephone Encounter (Signed)
Dr. Jimmey RalphParker saw this patient and gave injection.

## 2017-11-21 NOTE — Telephone Encounter (Signed)
I have called and left a detailed message (patient identified herself) explaining that Dr. Jimmey RalphParker is sending in diclofenac and that patient is to stop taking ibuprofen and Aleve while taking this medication.  I advised that she is still ok to take Tylenol 1000 mg every 8 hours as needed while taking diclofenac twice daily.  If she continues to have severe shoulder pain, may need to see Dr. Berline Choughigby or ortho.  Asked patient to please call back in a few days if not feeling better.  I recommended that she call and schedule an appointment with her PCP if she continues to have symptoms such as hives.  CRM created.

## 2017-12-02 ENCOUNTER — Telehealth: Payer: Self-pay | Admitting: *Deleted

## 2017-12-02 NOTE — Telephone Encounter (Signed)
Called and left voicemail requesting call back to discuss appointment that was scheduled through Mychart.

## 2017-12-03 NOTE — Telephone Encounter (Signed)
I have spoken with this patient.  Please see other messages, including MyChart messages.

## 2017-12-05 ENCOUNTER — Encounter: Payer: Self-pay | Admitting: Family Medicine

## 2017-12-05 ENCOUNTER — Ambulatory Visit (INDEPENDENT_AMBULATORY_CARE_PROVIDER_SITE_OTHER): Payer: BLUE CROSS/BLUE SHIELD | Admitting: Family Medicine

## 2017-12-05 ENCOUNTER — Ambulatory Visit: Payer: Self-pay | Admitting: Family Medicine

## 2017-12-05 VITALS — BP 128/86 | HR 84 | Temp 98.6°F | Ht 62.0 in | Wt 218.6 lb

## 2017-12-05 DIAGNOSIS — M25511 Pain in right shoulder: Secondary | ICD-10-CM

## 2017-12-05 DIAGNOSIS — E669 Obesity, unspecified: Secondary | ICD-10-CM

## 2017-12-05 DIAGNOSIS — F339 Major depressive disorder, recurrent, unspecified: Secondary | ICD-10-CM | POA: Diagnosis not present

## 2017-12-05 MED ORDER — FLUOXETINE HCL 20 MG PO TABS: 20.0000 mg | ORAL_TABLET | Freq: Every day | ORAL | 0 refills | Status: DC

## 2017-12-05 MED ORDER — NAPROXEN 500 MG PO TABS: 500.0000 mg | ORAL_TABLET | Freq: Two times a day (BID) | ORAL | 0 refills | Status: DC

## 2017-12-05 MED ORDER — DIAZEPAM 2 MG PO TABS: 2.0000 mg | ORAL_TABLET | Freq: Every day | ORAL | 0 refills | Status: DC

## 2017-12-05 MED ORDER — PHENTERMINE HCL 37.5 MG PO TABS: 37.5000 mg | ORAL_TABLET | Freq: Every day | ORAL | 0 refills | Status: DC

## 2017-12-05 NOTE — Progress Notes (Signed)
Christina Hoffman is a 47 y.o. female is here for follow up.  History of Present Illness:   [name] CMA acting as scribe for Dr. Earlene Plater.  HPI: See Assessment and Plan section for Problem Based Charting of issues discussed today.  Patient comes in today for follow up on her depression.   Health Maintenance Due  Topic Date Due  . TETANUS/TDAP  05/26/1988  . PAP SMEAR  05/26/1990  . INFLUENZA VACCINE  06/26/2017   Depression screen [facility] 2/9 12/05/2017 03/06/2017 12/13/2015  Decreased Interest 0 2 0  Down, Depressed, Hopeless 2 2 0  PHQ - 2 Score 2 4 0  Altered sleeping 2 1 -  Tired, decreased energy 2 0 -  Change in appetite 2 2 -  Feeling bad or failure about yourself  0 2 -  Trouble concentrating 2 2 -  Moving slowly or fidgety/restless 2 0 -  Suicidal thoughts 0 0 -  PHQ-9 Score 12 11 -  Difficult doing work/chores Extremely dIfficult Extremely dIfficult -   PMHx, SurgHx, SocialHx, FamHx, Medications, and Allergies were reviewed in the Visit Navigator and updated as appropriate.   Patient Active Problem List   Diagnosis Date Noted  . Obesity (BMI 30-39.9) 03/10/2017  . Depression, recurrent (HCC) 03/10/2017  . Seasonal allergies   . Polycystic ovarian syndrome    Social History   Tobacco Use  . Smoking status: Never Smoker  . Smokeless tobacco: Never Used  Substance Use Topics  . Alcohol use: No  . Drug use: No   Current Medications and Allergies:   .  albuterol (PROVENTIL HFA;VENTOLIN HFA) 108 (90 Base) MCG/ACT inhaler, Inhale 2 puffs into the lungs every 6 (six) hours as needed for wheezing or shortness of breath., Disp: 1 Inhaler, Rfl: 1 .  albuterol (PROVENTIL HFA;VENTOLIN HFA) 108 (90 Base) MCG/ACT inhaler, Inhale 2 puffs into the lungs every 6 (six) hours as needed for wheezing or shortness of breath., Disp: 1 Inhaler, Rfl: 0 .  diclofenac (VOLTAREN) 75 MG EC tablet, Take 1 tablet (75 mg total) by mouth 2 (two) times daily., Disp: 30 tablet, Rfl:  0 .  Norgestimate-Ethinyl Estradiol Triphasic 0.18/0.215/0.25 MG-25 MCG tab, Take 1 tablet by mouth daily., Disp: 1 Package, Rfl: 11 .  nystatin-triamcinolone ointment (MYCOLOG), Apply 1 application 2 (two) times daily topically., Disp: 30 g, Rfl: 0 .  phentermine (ADIPEX-P) 37.5 MG tablet, Take 1 tablet (37.5 mg total) by mouth daily before breakfast., Disp: 30 tablet, Rfl: 2 .  sertraline (ZOLOFT) 50 MG tablet, Take 1 tablet (50 mg total) by mouth daily., Disp: 30 tablet, Rfl: 3   Allergies  Allergen Reactions  . Metronidazole Hives   Review of Systems   Pertinent items are noted in the HPI. Otherwise, ROS is negative.  Vitals:   Vitals:   12/05/17 0810  BP: 128/86  Pulse: 84  Temp: 98.6 F (37 C)  TempSrc: Oral  SpO2: 97%  Weight: 218 lb 9.6 oz (99.2 kg)  Height: 5\' 2"  (1.575 m)     Body mass index is 39.98 kg/m.  Physical Exam:   Physical Exam  Constitutional: She is oriented to person, place, and time. She appears well-developed and well-nourished. No distress.  HENT:  Head: Normocephalic and atraumatic.  Right Ear: External ear normal.  Left Ear: External ear normal.  Nose: Nose normal.  Mouth/Throat: Oropharynx is clear and moist.  Eyes: Conjunctivae and EOM are normal. Pupils are equal, round, and reactive to light.  Neck: Normal  range of motion. Neck supple. No thyromegaly present.  Cardiovascular: Normal rate, regular rhythm, normal heart sounds and intact distal pulses.  Pulmonary/Chest: Effort normal and breath sounds normal.  Abdominal: Soft. Bowel sounds are normal.  Musculoskeletal:       Right shoulder: She exhibits decreased range of motion, spasm and decreased strength.  Lymphadenopathy:    She has no cervical adenopathy.  Neurological: She is alert and oriented to person, place, and time.  Skin: Skin is warm and dry. Capillary refill takes less than 2 seconds.  Psychiatric: She has a normal mood and affect. Her behavior is normal.  Nursing note  and vitals reviewed.   Assessment and Plan:   1. Obesity (BMI 30-39.9) Patient in action stage.  We reviewed healthy diet changes and exercise for about 10 minutes.  Pharmacologic intervention as below.  Benefits and risks of medication reviewed.  Follow-up in 3 months for recheck.  - phentermine (ADIPEX-P) 37.5 MG tablet; Take 1 tablet (37.5 mg total) by mouth daily before breakfast.  Dispense: 30 tablet; Refill: 0  2. Episode of recurrent major depressive disorder, unspecified depression episode severity (HCC) Patient has situational anxiety and depression.  She hates her job and feels that she works in a toxic environment.  She is still looking for a new option.  She was previously prescribed Zoloft but did not feel that it helped very much.  We discussed trialing another medication.  She agrees to the below.  We reviewed benefits versus risks of the medication and precautions.  - FLUoxetine (PROZAC) 20 MG tablet; Take 1 tablet (20 mg total) by mouth daily.  Dispense: 30 tablet; Refill: 0  3. Acute pain of right shoulder New issue.  Patient complains of pain in her right shoulder that radiates to her upper arm.  She denies overt injury.  She has been trying to take anti-inflammatories and to rest the shoulder.  Her job requires her to sit in a still position with her right arm flexed.  She is concerned that the repetitive motion at work is worsening her symptoms.  We reviewed the importance of stretches and exercises.  Okay medications as below for 1 week.  If not improving, will refer to sports medicine.  - naproxen (NAPROSYN) 500 MG tablet; Take 1 tablet (500 mg total) by mouth 2 (two) times daily with a meal.  Dispense: 30 tablet; Refill: 0 - diazepam (VALIUM) 2 MG tablet; Take 1 tablet (2 mg total) by mouth at bedtime. For one week.  Dispense: 7 tablet; Refill: 0   . Reviewed expectations re: course of current medical issues. . Discussed self-management of symptoms. . Outlined signs and  symptoms indicating need for more acute intervention. . Patient verbalized understanding and all questions were answered. Marland Kitchen. Health Maintenance issues including appropriate healthy diet, exercise, and smoking avoidance were discussed with patient. . See orders for this visit as documented in the electronic medical record. . Patient received an After Visit Summary.  Helane RimaErica Ashad Fawbush, DO [place], [facility] 12/07/2017

## 2017-12-12 ENCOUNTER — Ambulatory Visit: Payer: Self-pay | Admitting: Family Medicine

## 2018-01-01 ENCOUNTER — Other Ambulatory Visit: Payer: Self-pay | Admitting: Family Medicine

## 2018-01-01 DIAGNOSIS — F339 Major depressive disorder, recurrent, unspecified: Secondary | ICD-10-CM

## 2018-01-02 NOTE — Progress Notes (Signed)
Christina Hoffman is a 47 y.o. female is here for follow up.  History of Present Illness:   HPI:   1. Depression, recurrent (HCC).  Patient is tolerating the Prozac.  Her PHQ 9 is still very high.  She has no thoughts of hurting herself or others.  She recently quit her toxic job and is excited to start in a new office on Monday.  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 She IS hopeful today, which is new.   2. Right shoulder pain. Is doing better no problems with it now.   3. Stressful workplace. Quit last week.   4. Obesity (BMI 30-39.9).    Wt Readings from Last 3 Encounters:  01/03/18 222 lb 12.8 oz (101.1 kg)  12/05/17 218 lb 9.6 oz (99.2 kg)  11/14/17 220 lb 9.6 oz (100.1 kg)   She has not taken Phentermine as she is waiting to get the anxiety/derpression under control first.   Health Maintenance Due  Topic Date Due  . TETANUS/TDAP  05/26/1988  . PAP SMEAR  05/26/1990  . INFLUENZA VACCINE  06/26/2017   [facility] 01/03/2018 12/05/2017 03/06/2017  Decreased Interest 3 0 2  Down, Depressed, Hopeless 3 2 2   PHQ - 2 Score 6 2 4   Altered sleeping 3 2 1   Tired, decreased energy 3 2 0  Change in appetite 3 2 2   Feeling bad or failure about yourself  3 0 2  Trouble concentrating 3 2 2   Moving slowly or fidgety/restless 3 2 0  Suicidal thoughts 0 0 0  PHQ-9 Score 24 12 11   Difficult doing work/chores Very difficult Extremely dIfficult Extremely dIfficult   PMHx, SurgHx, SocialHx, FamHx, Medications, and Allergies were reviewed in the Visit Navigator and updated as appropriate.   Patient Active Problem List   Diagnosis Date Noted  . Obesity (BMI 30-39.9) 03/10/2017  . Depression, recurrent (HCC) 03/10/2017  . Seasonal allergies   . Polycystic ovarian syndrome    Social History   Tobacco Use  . Smoking status: Never Smoker  . Smokeless tobacco: Never Used  Substance Use Topics  . Alcohol use: No  . Drug use: No   Current  Medications and Allergies:   .  albuterol (PROVENTIL HFA;VENTOLIN HFA) 108 (90 Base) MCG/ACT inhaler, Inhale 2 puffs into the lungs every 6 (six) hours as needed for wheezing or shortness of breath., Disp: 1 Inhaler, Rfl: 1 .  diazepam (VALIUM) 2 MG tablet, Take 1 tablet (2 mg total) by mouth at bedtime. For one week., Disp: 7 tablet, Rfl: 0 .  diclofenac (VOLTAREN) 75 MG EC tablet, Take 1 tablet (75 mg total) by mouth 2 (two) times daily., Disp: 30 tablet, Rfl: 0 .  FLUoxetine (PROZAC) 20 MG tablet, TAKE 1 TABLET(20 MG) BY MOUTH DAILY, Disp: 30 tablet, Rfl: 5 .  naproxen (NAPROSYN) 500 MG tablet, Take 1 tablet (500 mg total) by mouth 2 (two) times daily with a meal., Disp: 30 tablet, Rfl: 0 .  Norgestimate-Ethinyl Estradiol Triphasic 0.18/0.215/0.25 MG-25 MCG tab, Take 1 tablet by mouth daily., Disp: 1 Package, Rfl: 11 .  nystatin-triamcinolone ointment (MYCOLOG), Apply 1 application 2 (two) times daily topically., Disp: 30 g, Rfl: 0 .  phentermine (ADIPEX-P) 37.5 MG tablet, Take 1 tablet (37.5 mg total) by mouth daily before breakfast., Disp: 30 tablet, Rfl: 0 .  sertraline (ZOLOFT) 50 MG tablet, Take 1 tablet (50 mg total) by mouth daily., Disp: 30 tablet, Rfl: 3  Allergies  Allergen Reactions  . Metronidazole Hives   Review of Systems   Pertinent items are noted in the HPI. Otherwise, ROS is negative.  Vitals:   Vitals:   01/03/18 0809  BP: 126/82  Pulse: 94  Temp: 98.7 F (37.1 C)  TempSrc: Oral  SpO2: 95%  Weight: 222 lb 12.8 oz (101.1 kg)     Body mass index is 40.75 kg/m.   Physical Exam:   Physical Exam  Constitutional: She appears well-nourished.  HENT:  Head: Normocephalic and atraumatic.  Eyes: EOM are normal. Pupils are equal, round, and reactive to light.  Neck: Normal range of motion. Neck supple.  Cardiovascular: Normal rate, regular rhythm, normal heart sounds and intact distal pulses.  Pulmonary/Chest: Effort normal.  Abdominal: Soft.  Skin: Skin is  warm.  Psychiatric: She has a normal mood and affect. Her behavior is normal.  Nursing note and vitals reviewed.   Assessment and Plan:   [name] was seen today for follow-up.  Diagnoses and all orders for this visit:  Depression, recurrent (HCC) Comments: Continue current treatment for now. Red flags reviewed.  Right shoulder pain Comments: Resolved.  Stressful workplace Comments: Resolved.  Obesity (BMI 30-39.9) Comments: The patient is asked to make an attempt to improve diet and exercise patterns to aid in medical management of this problem.   Need for Tdap vaccination -     Tdap vaccine greater than or equal to 7yo IM  Need for immunization against influenza -     Flu Vaccine QUAD 36+ mos IM   . Reviewed expectations re: course of current medical issues. . Discussed self-management of symptoms. . Outlined signs and symptoms indicating need for more acute intervention. . Patient verbalized understanding and all questions were answered. Marland Kitchen Health Maintenance issues including appropriate healthy diet, exercise, and smoking avoidance were discussed with patient. . See orders for this visit as documented in the electronic medical record. . Patient received an After Visit Summary.  Helane Rima, DO [place], [facility] 01/03/2018  Future Appointments  Date Time Provider Department Center  04/04/2018  2:00 PM Helane Rima, DO [facility]

## 2018-01-03 ENCOUNTER — Encounter: Payer: Self-pay | Admitting: Family Medicine

## 2018-01-03 ENCOUNTER — Ambulatory Visit: Payer: BLUE CROSS/BLUE SHIELD | Admitting: Family Medicine

## 2018-01-03 VITALS — BP 126/82 | HR 94 | Temp 98.7°F | Wt 222.8 lb

## 2018-01-03 DIAGNOSIS — M25511 Pain in right shoulder: Secondary | ICD-10-CM

## 2018-01-03 DIAGNOSIS — F339 Major depressive disorder, recurrent, unspecified: Secondary | ICD-10-CM | POA: Diagnosis not present

## 2018-01-03 DIAGNOSIS — E669 Obesity, unspecified: Secondary | ICD-10-CM | POA: Diagnosis not present

## 2018-01-03 DIAGNOSIS — Z23 Encounter for immunization: Secondary | ICD-10-CM | POA: Diagnosis not present

## 2018-01-03 DIAGNOSIS — Z566 Other physical and mental strain related to work: Secondary | ICD-10-CM

## 2018-01-03 MED ORDER — NORGESTIMATE-ETH ESTRADIOL 0.25-35 MG-MCG PO TABS: ORAL_TABLET | ORAL | 4 refills | Status: DC

## 2018-01-03 MED ORDER — PHENTERMINE HCL 37.5 MG PO TABS: 37.5000 mg | ORAL_TABLET | Freq: Every day | ORAL | 0 refills | Status: DC

## 2018-01-27 ENCOUNTER — Ambulatory Visit: Payer: BLUE CROSS/BLUE SHIELD | Admitting: Family Medicine

## 2018-01-31 ENCOUNTER — Ambulatory Visit (INDEPENDENT_AMBULATORY_CARE_PROVIDER_SITE_OTHER): Payer: 59 | Admitting: Family Medicine

## 2018-01-31 ENCOUNTER — Encounter: Payer: Self-pay | Admitting: Family Medicine

## 2018-01-31 VITALS — BP 130/80 | HR 79 | Temp 98.3°F | Wt 216.4 lb

## 2018-01-31 DIAGNOSIS — E669 Obesity, unspecified: Secondary | ICD-10-CM

## 2018-01-31 DIAGNOSIS — F339 Major depressive disorder, recurrent, unspecified: Secondary | ICD-10-CM

## 2018-01-31 MED ORDER — BUPROPION HCL ER (SR) 150 MG PO TB12
150.0000 mg | ORAL_TABLET | Freq: Two times a day (BID) | ORAL | 3 refills | Status: DC
Start: 2018-01-31 — End: 2021-11-02

## 2018-01-31 NOTE — Assessment & Plan Note (Signed)
Wt Readings from Last 3 Encounters:  01/31/18 216 lb 6.4 oz (98.2 kg)  01/03/18 222 lb 12.8 oz (101.1 kg)  12/05/17 218 lb 9.6 oz (99.2 kg)   Ideal body weight: 50.1 kg (110 lb 7.2 oz) Adjusted ideal body weight: 69.3 kg (152 lb 13.3 oz)

## 2018-01-31 NOTE — Progress Notes (Signed)
Christina Hoffman is a 47 y.o. female is here for follow up.  History of Present Illness:   Christina Hoffman, Christina Hoffman acting as scribe for Dr. Helane Hoffman.   HPI: Patient in office today for follow up on Prozac. Patient feels like it is not working as well for the last few weeks. Most of her problems are related to work situations.   Review of Systems  Constitutional: Negative for chills.  HENT: Negative for hearing loss and tinnitus.   Eyes: Negative for blurred vision and double vision.  Respiratory: Negative for cough.   Cardiovascular: Negative for chest pain and palpitations.  Gastrointestinal: Negative for heartburn and nausea.  Genitourinary: Negative for dysuria and urgency.  Musculoskeletal: Negative for myalgias.  Skin: Negative for rash.  Neurological: Positive for headaches. Negative for dizziness.       Started with medications   Endo/Heme/Allergies: Does not bruise/bleed easily.   Health Maintenance Due  Topic Date Due  . PAP SMEAR  05/26/1990   Depression screen Christina Hoffman 2/9 01/31/2018 01/03/2018 12/05/2017  Decreased Interest 1 3 0  Down, Depressed, Hopeless 2 3 2   PHQ - 2 Score 3 6 2   Altered sleeping 3 3 2   Tired, decreased energy 3 3 2   Change in appetite 3 3 2   Feeling bad or failure about yourself  0 3 0  Trouble concentrating 1 3 2   Moving slowly or fidgety/restless 0 3 2  Suicidal thoughts 0 0 0  PHQ-9 Score 13 24 12   Difficult doing work/chores Extremely dIfficult Very difficult Extremely dIfficult   PMHx, SurgHx, SocialHx, FamHx, Medications, and Allergies were reviewed in the Visit Navigator and updated as appropriate.   Patient Active Problem List   Diagnosis Date Noted  . Obesity (BMI 30-39.9) 03/10/2017  . Depression, recurrent (HCC), work-related, on Zoloft and Wellbutrin 03/10/2017  . Seasonal allergies   . Polycystic ovarian syndrome, on OCPs    Social History   Tobacco Use  . Smoking status: Never Smoker  . Smokeless tobacco: Never  Used  Substance Use Topics  . Alcohol use: No  . Drug use: No   Current Medications and Allergies:   .  FLUoxetine (PROZAC) 20 MG tablet, TAKE 1 TABLET(20 MG) BY MOUTH DAILY, Disp: 30 tablet, Rfl: 5 .  norgestimate-ethinyl estradiol (SPRINTEC 28) 0.25-35 MG-MCG tablet, 1 po daily x 3 months, Disp: 3 Package, Rfl: 4  Allergies  Allergen Reactions  . Metronidazole Hives   Review of Systems   Pertinent items are noted in the HPI. Otherwise, ROS is negative.  Vitals:   Vitals:   01/31/18 0855  BP: 130/80  Pulse: 79  Temp: 98.3 F (36.8 C)  TempSrc: Oral  SpO2: 94%  Weight: 216 lb 6.4 oz (98.2 kg)     Body mass index is 39.58 kg/m.  Physical Exam:   Physical Exam  Constitutional: She is oriented to person, place, and time. She appears well-developed and well-nourished. No distress.  HENT:  Head: Normocephalic and atraumatic.  Right Ear: External ear normal.  Left Ear: External ear normal.  Nose: Nose normal.  Mouth/Throat: Oropharynx is clear and moist.  Eyes: Conjunctivae and EOM are normal. Pupils are equal, round, and reactive to light.  Neck: Normal range of motion. Neck supple. No thyromegaly present.  Cardiovascular: Normal rate, regular rhythm, normal heart sounds and intact distal pulses.  Pulmonary/Chest: Effort normal and breath sounds normal.  Abdominal: Soft. Bowel sounds are normal.  Musculoskeletal: Normal range of motion.  Lymphadenopathy:  She has no cervical adenopathy.  Neurological: She is alert and oriented to person, place, and time.  Skin: Skin is warm and dry. Capillary refill takes less than 2 seconds.  Psychiatric: She has a normal mood and affect. Her behavior is normal.  Nursing note and vitals reviewed.   Assessment and Plan:   Current Problem List updated with notes today.   Patient Active Problem List   Diagnosis Date Noted  . Obesity (BMI 30-39.9) 03/10/2017  . Depression, recurrent (HCC), work-related, on Zoloft and  Wellbutrin 03/10/2017  . Seasonal allergies   . Polycystic ovarian syndrome, on OCPs    Christina Hoffman was seen today for follow-up.  Diagnoses and all orders for this visit:  Depression, recurrent (HCC), work-related, on Zoloft and Wellbutrin Comments: Uncontrolled.  Unfortunately, the patient's PHQ 9 is worse than at her last visit. After discussion, patient would like to start below medication. Expectations, risks, and potential side effects reviewed.  Patient perseverates on her unhappiness at work.  This is her second job in the past few months with the same issues.  We discussed stress reduction and work life balance.  I did recommend she follows up with a psychologist.  Information was provided to her.  A work note was given for 1 week as well. Orders: -     buPROPion (WELLBUTRIN SR) 150 MG 12 hr tablet; Take 1 tablet (150 mg total) by mouth 2 (two) times daily.  Obesity (BMI 30-39.9) Comments: The patient is asked to make an attempt to improve diet and exercise patterns to aid in medical management of this problem.   No orders of the defined types were placed in this encounter.  Meds ordered this encounter  Medications  . buPROPion (WELLBUTRIN SR) 150 MG 12 hr tablet    Sig: Take 1 tablet (150 mg total) by mouth 2 (two) times daily.    Dispense:  60 tablet    Refill:  3   . Reviewed expectations re: course of current medical issues. . Discussed self-management of symptoms. . Outlined signs and symptoms indicating need for more acute intervention. . Patient verbalized understanding and all questions were answered. Marland Kitchen. Health Maintenance issues including appropriate healthy diet, exercise, and smoking avoidance were discussed with patient. . See orders for this visit as documented in the electronic medical record. . Patient received an After Visit Summary.  Christina Hoffman served as Neurosurgeonscribe during this visit. History, Physical, and Plan performed by medical provider. The above documentation has been  reviewed and is accurate and complete. Christina RimaErica Niaomi Hoffman, D.O.  Christina RimaErica Yi Haugan, DO [place] 01/31/2018

## 2018-02-07 ENCOUNTER — Ambulatory Visit: Payer: Self-pay | Admitting: *Deleted

## 2018-02-07 NOTE — Telephone Encounter (Signed)
FYI

## 2018-02-07 NOTE — Telephone Encounter (Addendum)
TC to human resources at her place of employment to report she would not be back to work today, as we had discussed.   Reason for Disposition . Severe difficulty breathing (e.g., struggling for each breath, speaks in single words)  Answer Assessment - Initial Assessment Questions Patient phoned in actively having a panic attack. She stated she was sitting in her car in the parking lot, alone. She denied any thoughts of harming herself or others. She was crying profusely. Attempts to talk her down failed at the beginning of the call. After while, she would calm down briefly and could provide some information. She had just returned to work today after a week long medical leave ordered by Dr. Earlene Plater. She stated a co-worker made a statement out loud that upset her and she was afraid it would get to human resources that it was her who said it and now is worried she might get fired. She did not want to go back inside upset like she was. She gave the number for the HR department which I said I would phone to let them know she wouldn't be back today. Most of her responses to my questions were unintelligible due to her crying so loudly. She reported she was in the parking lot of the [employer]. She asked for EMS not to be called. Eventually, I did call the ER at that location and someone was sent out to the parking lot to talk with her. She was calm by the time someone came out to see her. She stated she would call the therapist Dr. Earlene Plater recommended.    1. CONCERN: "What happened that made you call today?"     Patient was at work and a co-worker said something to offend her and she feels she will be blamed.  2. ANXIETY SYMPTOM SCREENING: "Can you describe how you have been feeling?"  (e.g., tense, restless, panicky, anxious, keyed up, trouble sleeping, trouble concentrating)    Patient was crying and trying to talk at the same time. I could not understand a lot of what she was  saying. 3. ONSET: "How long have you been feeling this way?"     This morning after an incident at work.  4. RECURRENT: "Have you felt this way before?"  If yes: "What happened that time?" "What helped these feelings go away in the past?"      5. RISK OF HARM - SUICIDAL IDEATION:  "Do you ever have thoughts of hurting or killing yourself?"  (e.g., yes, no, no but preoccupation with thoughts about death) No   - INTENT:  "Do you have thoughts of hurting or killing yourself right NOW?" (e.g., yes, no, N/A) No   - PLAN: "Do you have a specific plan for how you would do this?" (e.g., gun, knife, overdose, no plan, N/A)    NA 6. RISK OF HARM - HOMICIDAL IDEATION:  "Do you ever have thoughts of hurting or killing someone else?"  (e.g., yes, no, no but preoccupation with thoughts about death) NO   - INTENT:  "Do you have thoughts of hurting or killing someone right NOW?" (e.g., yes, no, N/A) No   - PLAN: "Do you have a specific plan for how you would do this?" (e.g., gun, knife, no plan, N/A)   NA 7. FUNCTIONAL IMPAIRMENT: "How have things been going for you overall in your life? Have you had any more difficulties than usual doing your normal daily activities?"  (e.g., better,  same, worse; self-care, school, work, interactions)     Depression and trouble at work with co-workers. Recently was placed on medical leave for one week for depression. 8. SUPPORT: "Who is with you now?" "Who do you live with?" "Do you have family or friends nearby who you can talk to?"     No one. She gave a number for Casim[facility] NeedleMichael [phone], tried to phone but the number would not allow a message to be left. . THERAPIST: "Do you have a counselor or therapist? Name?" Dr. Earlene PlaterWallace gave her the name and number of a therapist but she hasn't called for an appointment, yet.  10. STRESSORS: "Has there been any new stress or recent changes in your life?"      11. CAFFEINE ABUSE: "Do you drink caffeinated beverages, and how much each day?"  (e.g., coffee, tea, colas)     12. SUBSTANCE ABUSE: "Do you use any illegal drugs or alcohol?"     13. OTHER SYMPTOMS: "Do you have any other physical symptoms right now?" (e.g., chest pain, palpitations, difficulty breathing, fever)        14. PREGNANCY: "Is there any chance you are pregnant?" "When was your last menstrual period?"  Protocols used: ANXIETY AND PANIC ATTACK-A-AH

## 2018-02-07 NOTE — Telephone Encounter (Signed)
See note as soon as possible.  °

## 2018-04-04 ENCOUNTER — Ambulatory Visit: Payer: BLUE CROSS/BLUE SHIELD | Admitting: Family Medicine

## 2018-04-29 ENCOUNTER — Other Ambulatory Visit: Payer: Self-pay | Admitting: Family Medicine

## 2018-04-29 DIAGNOSIS — F339 Major depressive disorder, recurrent, unspecified: Secondary | ICD-10-CM

## 2018-04-29 NOTE — Telephone Encounter (Signed)
Please advise. It looks like you changed medication for patient.

## 2018-05-05 ENCOUNTER — Ambulatory Visit: Payer: BLUE CROSS/BLUE SHIELD | Admitting: Family Medicine

## 2018-05-31 ENCOUNTER — Encounter: Payer: Self-pay | Admitting: Family Medicine

## 2021-11-01 ENCOUNTER — Encounter: Payer: Self-pay | Admitting: Emergency Medicine

## 2021-11-01 ENCOUNTER — Other Ambulatory Visit: Payer: Self-pay

## 2021-11-01 ENCOUNTER — Emergency Department (EMERGENCY_DEPARTMENT_HOSPITAL)
Admission: EM | Admit: 2021-11-01 | Discharge: 2021-11-02 | Disposition: A | Discharge status: Other Acute Inpatient Hospital | Payer: Medicaid Other | Source: Home / Self Care | Attending: Emergency Medicine | Admitting: Emergency Medicine

## 2021-11-01 DIAGNOSIS — Z79899 Other long term (current) drug therapy: Secondary | ICD-10-CM | POA: Insufficient documentation

## 2021-11-01 DIAGNOSIS — Y900 Blood alcohol level of less than 20 mg/100 ml: Secondary | ICD-10-CM | POA: Insufficient documentation

## 2021-11-01 DIAGNOSIS — Z20822 Contact with and (suspected) exposure to covid-19: Secondary | ICD-10-CM | POA: Insufficient documentation

## 2021-11-01 DIAGNOSIS — F409 Phobic anxiety disorder, unspecified: Secondary | ICD-10-CM | POA: Diagnosis present

## 2021-11-01 DIAGNOSIS — F22 Delusional disorders: Secondary | ICD-10-CM | POA: Diagnosis present

## 2021-11-01 DIAGNOSIS — F333 Major depressive disorder, recurrent, severe with psychotic symptoms: Secondary | ICD-10-CM | POA: Insufficient documentation

## 2021-11-01 DIAGNOSIS — F339 Major depressive disorder, recurrent, unspecified: Secondary | ICD-10-CM

## 2021-11-01 DIAGNOSIS — R440 Auditory hallucinations: Secondary | ICD-10-CM | POA: Diagnosis present

## 2021-11-01 DIAGNOSIS — Z008 Encounter for other general examination: Secondary | ICD-10-CM

## 2021-11-01 LAB — URINALYSIS, COMPLETE (UACMP) WITH MICROSCOPIC
Bacteria, UA: NONE SEEN
Bilirubin Urine: NEGATIVE
Glucose, UA: NEGATIVE mg/dL
Hgb urine dipstick: NEGATIVE
Ketones, ur: NEGATIVE mg/dL
Leukocytes,Ua: NEGATIVE
Nitrite: NEGATIVE
Protein, ur: NEGATIVE mg/dL
Specific Gravity, Urine: <1.005 — ABNORMAL LOW (ref 1.005–1.030)
pH: 6 (ref 5.0–8.0)

## 2021-11-01 LAB — ACETAMINOPHEN LEVEL: Acetaminophen (Tylenol), Serum: <10 ug/mL — ABNORMAL LOW (ref 10–30)

## 2021-11-01 LAB — CBC
HCT: 43.5 % (ref 36.0–46.0)
Hemoglobin: 14.5 g/dL (ref 12.0–15.0)
MCH: 31 pg (ref 26.0–34.0)
MCHC: 33.3 g/dL (ref 30.0–36.0)
MCV: 92.9 fL (ref 80.0–100.0)
Platelets: 355 10*3/uL (ref 150–400)
RBC: 4.68 MIL/uL (ref 3.87–5.11)
RDW: 13.8 % (ref 11.5–15.5)
WBC: 16.9 10*3/uL — ABNORMAL HIGH (ref 4.0–10.5)
nRBC: 0 % (ref 0.0–0.2)

## 2021-11-01 LAB — PREGNANCY, URINE: Preg Test, Ur: NEGATIVE

## 2021-11-01 LAB — URINE DRUG SCREEN, QUALITATIVE (ARMC ONLY)
Amphetamines, Ur Screen: NOT DETECTED
Barbiturates, Ur Screen: NOT DETECTED
Benzodiazepine, Ur Scrn: NOT DETECTED
Cannabinoid 50 Ng, Ur ~~LOC~~: NOT DETECTED
Cocaine Metabolite,Ur ~~LOC~~: NOT DETECTED
MDMA (Ecstasy)Ur Screen: NOT DETECTED
Methadone Scn, Ur: NOT DETECTED
Opiate, Ur Screen: NOT DETECTED
Phencyclidine (PCP) Ur S: NOT DETECTED
Tricyclic, Ur Screen: NOT DETECTED

## 2021-11-01 LAB — COMPREHENSIVE METABOLIC PANEL
ALT: 14 U/L (ref 0–44)
AST: 18 U/L (ref 15–41)
Albumin: 4.2 g/dL (ref 3.5–5.0)
Alkaline Phosphatase: 80 U/L (ref 38–126)
Anion gap: 10 (ref 5–15)
BUN: 19 mg/dL (ref 6–20)
CO2: 20 mmol/L — ABNORMAL LOW (ref 22–32)
Calcium: 9.2 mg/dL (ref 8.9–10.3)
Chloride: 104 mmol/L (ref 98–111)
Creatinine, Ser: 0.86 mg/dL (ref 0.44–1.00)
GFR, Estimated: >60 mL/min (ref >60)
Glucose, Bld: 123 mg/dL — ABNORMAL HIGH (ref 70–99)
Potassium: 4 mmol/L (ref 3.5–5.1)
Sodium: 134 mmol/L — ABNORMAL LOW (ref 135–145)
Total Bilirubin: 0.6 mg/dL (ref 0.3–1.2)
Total Protein: 8.1 g/dL (ref 6.5–8.1)

## 2021-11-01 LAB — RESP PANEL BY RT-PCR (FLU A&B, COVID) ARPGX2
Influenza A by PCR: NEGATIVE
Influenza B by PCR: NEGATIVE
SARS Coronavirus 2 by RT PCR: NEGATIVE

## 2021-11-01 LAB — SALICYLATE LEVEL: Salicylate Lvl: <7 mg/dL — ABNORMAL LOW (ref 7.0–30.0)

## 2021-11-01 LAB — ETHANOL: Alcohol, Ethyl (B): <10 mg/dL (ref <10)

## 2021-11-01 NOTE — ED Notes (Addendum)
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

## 2021-11-01 NOTE — ED Notes (Signed)
Pt has ear plugs present in her ears while with officers in the triage area.

## 2021-11-01 NOTE — ED Provider Notes (Signed)
[facility] Emergency Department Provider Note  ____________________________________________   Event Date/Time   First MD Initiated Contact with Patient 11/01/21 2105     (approximate)  I have reviewed the triage vital signs and the nursing notes.   HISTORY  Chief Complaint Psychiatric Evaluation    HPI Christina Hoffman is a 50 y.o. female with depression who comes in under IVC.  IVC paperwork was completed by [facility].  Patient is having difficulty time sleeping and has recently quit her job she is been having auditory hallucinations and paranoia.  Patient to me states that she does not want to talk about it.  She states that it was just a misunderstanding with her mother who was in her 21s he wanted her to be evaluated because she thought that she needed help however she feels like she does not need any help.  She denies any SI HI auditory or visual hallucinations.  She does not want to divulge to me why [facility] sent her here.  She denies any medical concerns like fevers, cough.          Past Medical History:  Diagnosis Date   Anxiety    Anxiety    Complication of anesthesia 1991   woke up during procedure   Depression, recurrent (HCC) 03/10/2017   Obesity (BMI 30-39.9) 03/10/2017   Pelvic peritoneal adhesions, female 05/27/2014   Polycystic ovarian syndrome    Seasonal allergies     Patient Active Problem List   Diagnosis Date Noted   Obesity (BMI 30-39.9) 03/10/2017   Depression, recurrent (HCC), work-related, on Zoloft and Wellbutrin 03/10/2017   Seasonal allergies    Polycystic ovarian syndrome, on OCPs     Past Surgical History:  Procedure Laterality Date   CESAREAN SECTION     CHOLECYSTECTOMY     LAPAROSCOPY N/A 05/27/2014   Procedure: LAPAROSCOPY WITH  LYSIS OF ADHESIONS;  Surgeon: Lavina Hamman, MD;  Location: [facility];  Service: Gynecology;  Laterality: N/A;    Prior to Admission medications   Medication Sig Start Date End Date  Taking? Authorizing Provider  buPROPion (WELLBUTRIN SR) 150 MG 12 hr tablet Take 1 tablet (150 mg total) by mouth 2 (two) times daily. 01/31/18   Helane Rima, DO  FLUoxetine (PROZAC) 20 MG tablet TAKE 1 TABLET(20 MG) BY MOUTH DAILY 01/01/18   Helane Rima, DO  norgestimate-ethinyl estradiol (SPRINTEC 28) 0.25-35 MG-MCG tablet 1 po daily x 3 months 01/03/18   Helane Rima, DO  phentermine (ADIPEX-P) 37.5 MG tablet Take 1 tablet (37.5 mg total) by mouth daily before breakfast. Patient not taking: Reported on 01/31/2018 01/03/18   Helane Rima, DO    Allergies Metronidazole  Family History  Problem Relation Age of Onset   Hyperlipidemia Mother     Social History Social History   Tobacco Use   Smoking status: Never   Smokeless tobacco: Never  Substance Use Topics   Alcohol use: No   Drug use: No      Review of Systems Constitutional: No fever/chills Eyes: No visual changes. ENT: No sore throat. Cardiovascular: Denies chest pain. Respiratory: Denies shortness of breath. Gastrointestinal: No abdominal pain.  No nausea, no vomiting.  No diarrhea.  No constipation. Genitourinary: Negative for dysuria. Musculoskeletal: Negative for back pain. Skin: Negative for rash. Neurological: Negative for headaches, focal weakness or numbness. Psych concern for paranoia All other ROS negative ____________________________________________   PHYSICAL EXAM:  VITAL SIGNS: ED Triage Vitals [11/01/21 1733]  Enc Vitals Group  BP 128/62     Pulse Rate 88     Resp 17     Temp 98.4 F (36.9 C)     Temp Source Oral     SpO2 98 %     Weight 216 lb 7.9 oz (98.2 kg)     Height 5\' 2"  (1.575 m)     Head Circumference      Peak Flow      Pain Score 0     Pain Loc      Pain Edu?      Excl. in GC?     Constitutional: Alert and oriented. Well appearing and in no acute distress. Eyes: Conjunctivae are normal. No swelling around eyes Head: Atraumatic. Nose: No  congestion/rhinnorhea. Mouth/Throat: Mucous membranes are moist.   Neck: No stridor. Trachea Midline. FROM Cardiovascular: Normal rate, no swelling noted Respiratory: No increased wob, no stridor Gastrointestinal: Soft and nontender. No distention. No abdominal bruits.  Musculoskeletal: No lower extremity tenderness nor edema.  No joint effusions. Neurologic:  Normal speech and language. No gross focal neurologic deficits are appreciated.  Skin:  Skin is warm, dry and intact. No rash noted. Psychiatric: Denies SI HI, auditory visual hallucinations GU: Deferred   ____________________________________________   LABS (all labs ordered are listed, but only abnormal results are displayed)  Labs Reviewed  COMPREHENSIVE METABOLIC PANEL - Abnormal; Notable for the following components:      Result Value   Sodium 134 (*)    CO2 20 (*)    Glucose, Bld 123 (*)    All other components within normal limits  SALICYLATE LEVEL - Abnormal; Notable for the following components:   Salicylate Lvl <7.0 (*)    All other components within normal limits  ACETAMINOPHEN LEVEL - Abnormal; Notable for the following components:   Acetaminophen (Tylenol), Serum <10 (*)    All other components within normal limits  CBC - Abnormal; Notable for the following components:   WBC 16.9 (*)    All other components within normal limits  ETHANOL  URINE DRUG SCREEN, QUALITATIVE ([facility] ONLY)   ____________________________________________   INITIAL IMPRESSION / ASSESSMENT AND PLAN / ED COURSE  Christina Hoffman was evaluated in Emergency Department on 11/01/2021 for the symptoms described in the history of present illness. She was evaluated in the context of the global COVID-19 pandemic, which necessitated consideration that the patient might be at risk for infection with the SARS-CoV-2 virus that causes COVID-19. Institutional protocols and algorithms that pertain to the evaluation of patients at risk for COVID-19  are in a state of rapid change based on information released by regulatory bodies including the CDC and federal and state organizations. These policies and algorithms were followed during the patient's care in the ED.    Pt is without any acute medical complaints. No exam findings to suggest medical cause of current presentation. Will order psychiatric screening labs and discuss further w/ psychiatric service.  White count slightly elevated with patient denies any infectious symptoms  D/d includes but is not limited to psychiatric disease, behavioral/personality disorder, inadequate socioeconomic support, medical.  Based on HPI, exam, unremarkable labs, no concern for acute medical problem at this time. No rigidity, clonus, hyperthermia, focal neurologic deficit, diaphoresis, tachycardia, meningismus, ataxia, gait abnormality or other finding to suggest this visit represents a non-psychiatric problem. Screening labs reviewed.    Given this, pt medically cleared, to be dispositioned per Psych.    The patient has been placed in psychiatric observation due to  the need to provide a safe environment for the patient while obtaining psychiatric consultation and evaluation, as well as ongoing medical and medication management to treat the patient's condition.  The patient has been placed under full IVC at this time.        ____________________________________________   FINAL CLINICAL IMPRESSION(S) / ED DIAGNOSES   Final diagnoses:  Evaluation by psychiatric service required      MEDICATIONS GIVEN DURING THIS VISIT:  Medications - No data to display   ED Discharge Orders     None        Note:  This document was prepared using Dragon voice recognition software and may include unintentional dictation errors.    Concha Se, MD 11/01/21 2114
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
## 2021-11-01 NOTE — ED Triage Notes (Signed)
Pt comes into the ED via [facility] Department under IVC.  Pt had IVC paperwork completed by [facility].  Pt apparently is having a difficult time sleeping, and has recently quit her job.  Pt has been having auditory hallucinations and paranoia.  Pt believes people are taking lawsuits out against her.  Pt currently is calm and cooperative at this time.  PT has been decompensating in the ability to be able to care for herself at home.  Pt states she lives with her daughter half the time and then resides independently the other half.

## 2021-11-01 NOTE — ED Notes (Signed)
NP Jacky talking with pt .
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
## 2021-11-01 NOTE — ED Notes (Signed)
Pt reports she did not want to talk with her mother because she is old.  Her mother became concerned for her well being.  Pt not sleeping, staring off into space.  Pt denies SI or HI.  Pt denies drugs or etoh use.  Pt calm and cooperative.  Pt staring off, sitting on stretcher.  Pt in hallway bed.

## 2021-11-02 ENCOUNTER — Encounter: Payer: Self-pay | Admitting: Psychiatry

## 2021-11-02 ENCOUNTER — Inpatient Hospital Stay
Admission: AD | Admit: 2021-11-02 | Discharge: 2021-11-07 | DRG: 885 | Disposition: A | Discharge status: Home / Self Care | Payer: Medicaid Other | Source: Intra-hospital | Attending: Psychiatry | Admitting: Psychiatry

## 2021-11-02 DIAGNOSIS — R440 Auditory hallucinations: Secondary | ICD-10-CM | POA: Diagnosis not present

## 2021-11-02 DIAGNOSIS — Z79890 Hormone replacement therapy (postmenopausal): Secondary | ICD-10-CM

## 2021-11-02 DIAGNOSIS — F419 Anxiety disorder, unspecified: Secondary | ICD-10-CM | POA: Diagnosis present

## 2021-11-02 DIAGNOSIS — Z8616 Personal history of COVID-19: Secondary | ICD-10-CM

## 2021-11-02 DIAGNOSIS — J302 Other seasonal allergic rhinitis: Secondary | ICD-10-CM | POA: Diagnosis present

## 2021-11-02 DIAGNOSIS — F333 Major depressive disorder, recurrent, severe with psychotic symptoms: Principal | ICD-10-CM

## 2021-11-02 DIAGNOSIS — F5105 Insomnia due to other mental disorder: Secondary | ICD-10-CM | POA: Diagnosis present

## 2021-11-02 DIAGNOSIS — Z6839 Body mass index (BMI) 39.0-39.9, adult: Secondary | ICD-10-CM

## 2021-11-02 DIAGNOSIS — F329 Major depressive disorder, single episode, unspecified: Secondary | ICD-10-CM | POA: Insufficient documentation

## 2021-11-02 DIAGNOSIS — E559 Vitamin D deficiency, unspecified: Secondary | ICD-10-CM | POA: Diagnosis present

## 2021-11-02 DIAGNOSIS — F339 Major depressive disorder, recurrent, unspecified: Secondary | ICD-10-CM | POA: Diagnosis not present

## 2021-11-02 DIAGNOSIS — E669 Obesity, unspecified: Secondary | ICD-10-CM | POA: Diagnosis present

## 2021-11-02 DIAGNOSIS — E282 Polycystic ovarian syndrome: Secondary | ICD-10-CM | POA: Diagnosis present

## 2021-11-02 DIAGNOSIS — Z79899 Other long term (current) drug therapy: Secondary | ICD-10-CM

## 2021-11-02 DIAGNOSIS — H9319 Tinnitus, unspecified ear: Secondary | ICD-10-CM

## 2021-11-02 DIAGNOSIS — Z9049 Acquired absence of other specified parts of digestive tract: Secondary | ICD-10-CM | POA: Diagnosis not present

## 2021-11-02 DIAGNOSIS — Z888 Allergy status to other drugs, medicaments and biological substances status: Secondary | ICD-10-CM | POA: Diagnosis not present

## 2021-11-02 DIAGNOSIS — Z20822 Contact with and (suspected) exposure to covid-19: Secondary | ICD-10-CM | POA: Diagnosis present

## 2021-11-02 MED ORDER — PHENTERMINE-TOPIRAMATE ER 3.75-23 MG PO CP24: 1.0000 | ORAL_CAPSULE | Freq: Every day | ORAL | Status: DC

## 2021-11-02 MED ORDER — ACETAMINOPHEN 325 MG PO TABS
650.0000 mg | ORAL_TABLET | Freq: Four times a day (QID) | ORAL | Status: DC | PRN
  Administered 2021-11-03: 650 mg via ORAL
  Filled 2021-11-02: qty 2

## 2021-11-02 MED ORDER — OLANZAPINE 5 MG PO TBDP
10.0000 mg | ORAL_TABLET | Freq: Once | ORAL | Status: AC
Start: 2021-11-02 — End: 2021-11-02
  Administered 2021-11-02: 10 mg via ORAL
  Filled 2021-11-02: qty 2

## 2021-11-02 MED ORDER — MAGNESIUM HYDROXIDE 400 MG/5ML PO SUSP: 30.0000 mL | Freq: Every day | ORAL | Status: DC | PRN

## 2021-11-02 MED ORDER — ONDANSETRON 4 MG PO TBDP
4.0000 mg | ORAL_TABLET | Freq: Once | ORAL | Status: AC
  Administered 2021-11-02: 4 mg via ORAL
  Filled 2021-11-02: qty 1

## 2021-11-02 MED ORDER — ALUM & MAG HYDROXIDE-SIMETH 200-200-20 MG/5ML PO SUSP: 30.0000 mL | ORAL | Status: DC | PRN

## 2021-11-02 NOTE — ED Notes (Signed)
Pt up to bathroom and sts, "I have to wash my hands." Pt found in bathroom trying to induce vomiting with her finger down her throat. Pt redirected back to her bed. MD made aware.

## 2021-11-02 NOTE — BH Assessment (Signed)
Comprehensive Clinical Assessment (CCA) Note  11/02/2021 Christina Hoffman [id number]  Chief Complaint:  Chief Complaint  Patient presents with   Psychiatric Evaluation   Visit Diagnosis: Major Depression, with psychotic features    Christina Hoffman is 50 year old female who presents to the ER due to family having concerns about her current mental and emotional state. Per the report of the family, the patient quit her job approximately two weeks ago, due to paranoia. While living in her own home, with her daughter, the patient started to isolate and disconnect from other. She denies having any AV/H, but reports of hearing her female neighbors voice telling her that's, and it has caused a significant about of distress in her life. Patient started staying with her parents and she would live the house due to fear of someone trying to harm her. While in the ER, patient believe she was poisoned and was scared someone was trying to kill her.  CCA Screening, Triage and Referral (STR)  Patient Reported Information How did you hear about [place]? Family/Friend  What Is the Reason for Your Visit/Call Today? Parents are concerned about her recent behaviors and mental state  How Long Has This Been Causing You Problems? 1 wk - 1 month  What Do You Feel Would Help You the Most Today? Treatment for Depression or other mood problem   Have You Recently Had Any Thoughts About Hurting Yourself? No  Are You Planning to Commit Suicide/Harm Yourself At This time? No   Have you Recently Had Thoughts About [place]? No  Are You Planning to Harm Someone at This Time? No  Explanation: No data recorded  Have You Used Any Alcohol or Drugs in the Past 24 Hours? No  How Long Ago Did You Use Drugs or Alcohol? No data recorded What Did You Use and How Much? No data recorded  Do You Currently Have a Therapist/Psychiatrist? No  Name of Therapist/Psychiatrist: No data recorded  Have You Been  Recently Discharged From Any Office Practice or Programs? No  Explanation of Discharge From Practice/Program: No data recorded    CCA Screening Triage Referral Assessment Type of Contact: Face-to-Face  Telemedicine Service Delivery:   Is this Initial or Reassessment? No data recorded Date Telepsych consult ordered in [facility]:  No data recorded Time Telepsych consult ordered in [facility]L:  No data recorded Location of Assessment: [facility] ED  Provider Location: [facility] ED   Collateral Involvement: No data recorded  Does Patient Have a [place]? No data recorded Name and Contact of Legal Guardian: No data recorded If Minor and Not Living with Parent(s), Who has Custody? No data recorded Is CPS involved or ever been involved? Never  Is APS involved or ever been involved? Never   Patient Determined To Be At Risk for Harm To Self or Others Based on Review of Patient Reported Information or Presenting Complaint? No  Method: No data recorded Availability of Means: No data recorded Intent: No data recorded Notification Required: No data recorded Additional Information for Danger to Others Potential: No data recorded Additional Comments for Danger to Others Potential: No data recorded Are There Guns or Other Weapons in Your Home? No data recorded Types of Guns/Weapons: No data recorded Are These Weapons Safely Secured?                            No data recorded Who Could Verify You Are Able To Have These  Secured: No data recorded Do You Have any Outstanding Charges, Pending Court Dates, Parole/Probation? No data recorded Contacted To Inform of Risk of Harm To Self or Others: No data recorded   Does Patient Present under Involuntary Commitment? No  IVC Papers Initial File Date: No data recorded  [place] of Residence: [place]   Patient Currently Receiving the Following Services: Not Receiving Services   Determination of Need: Emergent (2 hours)   Options For  Referral: ED Visit     CCA Biopsychosocial Patient Reported Schizophrenia/Schizoaffective Diagnosis in Past: No   Strengths: She can complete her ADL's, has a support system and received treatment in the past.   Mental Health Symptoms Depression:   Change in energy/activity; Difficulty Concentrating; Increase/decrease in appetite   Duration of Depressive symptoms:  Duration of Depressive Symptoms: Greater than two weeks   Mania:   Racing thoughts; Recklessness   Anxiety:    Restlessness; Irritability   Psychosis:   None   Duration of Psychotic symptoms:    Trauma:   N/A   Obsessions:   N/A   Compulsions:   N/A   Inattention:   N/A   Hyperactivity/Impulsivity:   N/A   Oppositional/Defiant Behaviors:   N/A   Emotional Irregularity:   N/A   Other Mood/Personality Symptoms:  No data recorded   Mental Status Exam Appearance and self-care  Stature:   Average   Weight:   Average weight   Clothing:   Age-appropriate   Grooming:   Normal   Cosmetic use:   None   Posture/gait:   Other (Comment)   Motor activity:   -- (Laying in the bed, sleep)   Sensorium  Attention:   -- (Patient sleeping)   Concentration:   -- (Laying in the bed, sleep)   Orientation:   -- (Laying in the bed)   Recall/memory:   -- (Laying in the bed, sleep)   Affect and Mood  Affect:   -- (Laying in the bed, sleep)   Mood:   -- (Laying in the bed, sleep)   Relating  Eye contact:   None   Facial expression:   -- (Laying in the bed, sleep)   Attitude toward examiner:   -- (Laying in the bed, sleep)   Thought and Language  Speech flow:  -- (Laying in the bed, sleep)   Thought content:   -- (Laying in the bed, sleep)   Preoccupation:   -- (Laying in the bed, sleep)   Hallucinations:   Auditory (Per chart)   Organization:  No data recorded  [employer] of Knowledge:   -- (Laying in the bed, sleep)   Intelligence:   -- (Laying  in the bed, sleep)   Abstraction:   -- (Laying in the bed, sleep)   Judgement:   -- (Laying in the bed, sleep)   Reality Testing:   -- (Laying in the bed, sleep)   Insight:   -- (Laying in the bed, sleep)   Decision Making:   Confused; Impulsive (Per Chart)   Social Functioning  Social Maturity:   Impulsive   Social Judgement:   -- (Laying in the bed, sleep)   Stress  Stressors:   Transitions   Coping Ability:   Overwhelmed; Exhausted   Skill Deficits:   Decision making   Supports:   Friends/Service system; Family     Religion: Religion/Spirituality Are You A Religious Person?: No  Leisure/Recreation: Leisure / Recreation Do You Have Hobbies?: No  Exercise/Diet: Exercise/Diet Do  You Exercise?: No Have You Gained or Lost A Significant Amount of Weight in the Past Six Months?: No Do You Follow a Special Diet?: No Do You Have Any Trouble Sleeping?: No   CCA Employment/Education Employment/Work Situation: Employment / Work Situation Employment Situation: Unemployed Patient's Job has Been Impacted by Current Illness: No Has Patient ever Been in Equities trader?: No  Education: Education Is Patient Currently Attending School?: No Did You Have An Individualized Education Program (IIEP): No Did You Have Any Difficulty At [employer]?: No Patient's Education Has Been Impacted by Current Illness: No   CCA Family/Childhood History Family and Relationship History: Family history Marital status: Single Does patient have children?: Yes How many children?: 1  Childhood History:  Childhood History By whom was/is the patient raised?: Both parents Did patient suffer any verbal/emotional/physical/sexual abuse as a child?: No Did patient suffer from severe childhood neglect?: No Has patient ever been sexually abused/assaulted/raped as an adolescent or adult?: No Was the patient ever a victim of a crime or a disaster?: No Witnessed domestic violence?: No Has  patient been affected by domestic violence as an adult?: No  Child/Adolescent Assessment:     CCA Substance Use Alcohol/Drug Use: Alcohol / Drug Use Pain Medications: See PTA Prescriptions: See PTA Over the Counter: See PTA History of alcohol / drug use?: No history of alcohol / drug abuse Longest period of sobriety (when/how long): n/a   ASAM's:  Six Dimensions of Multidimensional Assessment  Dimension 1:  Acute Intoxication and/or Withdrawal Potential:      Dimension 2:  Biomedical Conditions and Complications:      Dimension 3:  Emotional, Behavioral, or Cognitive Conditions and Complications:     Dimension 4:  Readiness to Change:     Dimension 5:  Relapse, Continued use, or Continued Problem Potential:     Dimension 6:  Recovery/Living Environment:     ASAM Severity Score:    ASAM Recommended Level of Treatment:     Substance use Disorder (SUD)    Recommendations for Services/Supports/Treatments:    Discharge Disposition:    DSM5 Diagnoses: Patient Active Problem List   Diagnosis Date Noted   Auditory hallucination 11/01/2021   Paranoid behavior (HCC) 11/01/2021   Insomnia due to anxiety and fear 11/01/2021   Obesity (BMI 30-39.9) 03/10/2017   Depression, recurrent (HCC), work-related, on Zoloft and Wellbutrin 03/10/2017   Seasonal allergies    Polycystic ovarian syndrome, on OCPs     Referrals to Alternative Service(s): Referred to Alternative Service(s):   Place:   Date:   Time:    Referred to Alternative Service(s):   Place:   Date:   Time:    Referred to Alternative Service(s):   Place:   Date:   Time:    Referred to Alternative Service(s):   Place:   Date:   Time:     Lilyan Gilford MS, LCAS, [facility], [facility] Therapeutic Triage Specialist 11/02/2021 3:45 PM

## 2021-11-02 NOTE — Tx Team (Signed)
Initial Treatment Plan 11/02/2021 6:16 PM [name] [id number]    PATIENT STRESSORS: Financial difficulties   Health problems     PATIENT STRENGTHS: Motivation for treatment/growth  Supportive family/friends    PATIENT IDENTIFIED PROBLEMS: Ineffective coping skills  Financial Issues                   DISCHARGE CRITERIA:  Improved stabilization in mood, thinking, and/or behavior Motivation to continue treatment in a less acute level of care  PRELIMINARY DISCHARGE PLAN: Outpatient therapy Return to previous living arrangement  PATIENT/FAMILY INVOLVEMENT: This treatment plan has been presented to and reviewed with the patient, Christina Hoffman, and/or family member.  The patient and family have been given the opportunity to ask questions and make suggestions.  Sharin Mons, RN 11/02/2021, 6:16 PM

## 2021-11-02 NOTE — ED Notes (Signed)
Pt given breakfast tray

## 2021-11-02 NOTE — Progress Notes (Signed)
Admission Note: report received from Bill RN patient is a 50 year old IVC'ed  female from [facility] Pt was calm and cooperative during assessment. Denies SI/HI but endorses the delusion that a man is stalking her. She also endorses that she hears him sometimes between 4am-10am Admission plan of care reviewed with pt, consents signed.  Personal belongings/skin assessment completed.   No contraband found.  Patient oriented to the unit, staff and room.  Routine safety checks initiated.  Verbalizes understanding of unit rules/protocols.   Patient is presently safe on the unit. No unsafe behaviors noted.  Q 15 minute safety checks maintained per unit protocol.  Patient ID: Christina Hoffman, female   DOB: [birth year], 50 y.o.   MRN: [id number]

## 2021-11-02 NOTE — ED Notes (Signed)
Pt lying in bed with blanket on with security and Clinical research associate by her bed.

## 2021-11-02 NOTE — ED Notes (Addendum)
error 

## 2021-11-02 NOTE — ED Notes (Signed)
IVC/Consult completed/Rec. Inpt Admit °

## 2021-11-02 NOTE — ED Notes (Signed)
Pt back in bathroom attempting to force vomiting at this time. Pt with her head and hair down in toilet bowl water with pt retching. Writer as well as Psychologist, sport and exercise in bathroom attempting to redirect pt to stand. Pt unwilling to stop.

## 2021-11-02 NOTE — ED Notes (Signed)
Pt back to bed with direction as well as verbal commands from Clinical research associate. Dispensing optician with pt to ensure she does not go back to bathroom.

## 2021-11-02 NOTE — ED Provider Notes (Addendum)
Emergency Medicine Observation Re-evaluation Note  Christina Hoffman is a 50 y.o. female, seen on rounds today.  Pt initially presented to the ED for complaints of Psychiatric Evaluation Currently, the patient is resting comfortably.  Physical Exam  BP 138/68 (BP Location: Left Arm)   Pulse 93   Temp 98.7 F (37.1 C) (Oral)   Resp 16   Ht 5\' 2"  (1.575 m)   Wt 98.2 kg   SpO2 96%   BMI 39.60 kg/m  Physical Exam Gen: No acute distress  Resp: Normal rise and fall of chest Neuro: Moving all four extremities Psych: Resting currently, previously when awake patient was very agitated and difficult to redirect.    ED Course / MDM  EKG:   I have reviewed the labs performed to date as well as medications administered while in observation.  Recent changes in the last 24 hours include increased agitation and paranoia.  Patient thought that nursing staff was poisoning her and requested medication to help her vomit.  Repeatedly went to the bathroom to try to self-induced vomiting.  Given Zyprexa ODT which patient agreed to take which seems to have helped her significantly and she has been resting comfortably since..   Patient states she is not on any medications other than Qsymia.  I do not see that this medication has been filled on the controlled substance reporting system.  We will hold this medication at this time given she appeared to be manic and paranoid.  Plan  Current plan is for psychiatric disposition. Christina Hoffman is under involuntary commitment.      Christina Hoffman, Konrad Felix, DO 11/02/21 0506    Christina Hoffman, 14/08/22, DO 11/02/21 [phone]

## 2021-11-02 NOTE — Progress Notes (Signed)
Pt calm and pleasant during assessment. Pt denies SI/H endorses delusions about a man stalking her. Pt given education, support, and encouragement to be active in her treatment plan. Pt didn't have any medications scheduled tonight and hasn't requested anything PRN. Pt being monitored Q 15 minutes for safety per unit protocol. Pt remains safe on the unit.

## 2021-11-02 NOTE — ED Notes (Signed)
Pt stands up quickly from hallway bed and sts, "I need something now. Im serious, I have a medical emergency. I just ate that sandwich and it has poisoning in the bread. I am allergic. Hurry, you have to get me a dissectant , it will expand and soak the poison up so I will throw it all up. Hurry, you have to get it now. Im going to die today. Hurry up, something bad is going to happen." Pt reassured that the sandwich tray that was provided by the previous RN was not poisoned and that she did not require medication to induce vomiting. Pt sitting back on bed and RN to MD to notify pts escalations of paranoia and hallucinations.

## 2021-11-03 ENCOUNTER — Inpatient Hospital Stay: Payer: Medicaid Other

## 2021-11-03 DIAGNOSIS — F333 Major depressive disorder, recurrent, severe with psychotic symptoms: Principal | ICD-10-CM

## 2021-11-03 DIAGNOSIS — H9319 Tinnitus, unspecified ear: Secondary | ICD-10-CM

## 2021-11-03 LAB — LIPID PANEL
Cholesterol: 240 mg/dL — ABNORMAL HIGH (ref 0–200)
HDL: 44 mg/dL (ref >40)
LDL Cholesterol: 171 mg/dL — ABNORMAL HIGH (ref 0–99)
Total CHOL/HDL Ratio: 5.5 RATIO
Triglycerides: 123 mg/dL (ref <150)
VLDL: 25 mg/dL (ref 0–40)

## 2021-11-03 LAB — TSH: TSH: 1.433 u[IU]/mL (ref 0.350–4.500)

## 2021-11-03 MED ORDER — RISPERIDONE 1 MG PO TABS
0.5000 mg | ORAL_TABLET | Freq: Every day | ORAL | Status: DC
  Administered 2021-11-03: 0.5 mg via ORAL
  Filled 2021-11-03: qty 1

## 2021-11-03 NOTE — Progress Notes (Signed)
D: Pt alert and oriented. Pt rates depression 0/10, hopelessness 0/10, and anxiety 2/10. Pt goal: "Go home." Pt reports energy level as normal and concentration as being good. Pt reports sleep last night as being poor. Pt did not receive medications for sleep. Pt reports experiencing 5/10 lower back pain at this time, prn meds given. Pt denies experiencing any SI/HI, or AVH at this time.   A: Scheduled medications administered to pt, per MD orders. Support and encouragement provided. Frequent verbal contact made. Routine safety checks conducted q15 minutes.   R: No adverse drug reactions noted. Pt verbally contracts for safety at this time. Pt complaint with medications. Pt interacts minimally with others on the unit, mostly isolating to her room. Pt remains safe at this time. Will continue to monitor.

## 2021-11-03 NOTE — [facility] Suicide Risk Assessment (Signed)
[facility] INPATIENT:  Family/Significant Other Suicide Prevention Education  Suicide Prevention Education:  Patient Refusal for Family/Significant Other Suicide Prevention Education: The patient Christina Hoffman has refused to provide written consent for family/significant other to be provided Family/Significant Other Suicide Prevention Education during admission and/or prior to discharge.  Physician notified.  SPE completed with pt, as pt refused to consent to family contact. SPI pamphlet provided to pt and pt was encouraged to share information with support network, ask questions, and talk about any concerns relating to SPE. Pt denies access to guns/firearms and verbalized understanding of information provided. Mobile Crisis information also provided to pt.  Glenis Smoker 11/03/2021, 3:56 PM

## 2021-11-03 NOTE — Progress Notes (Addendum)
   11/03/21 1045  Clinical Encounter Type  Visited With Patient  Visit Type Initial;Spiritual support;Social support  Referral From Patient  Consult/Referral To Chaplain;Other (Comment) (rounding)  Spiritual Encounters  Spiritual Needs Emotional;Prayer  Chaplain Burris visited with Pt at her request. Chaplain provided active and reflective listening and compassionate presence. Chaplain reflected back to Pt her resilience and evidence she offered of areas that indicate positive life function and appropriate support-seeking. Pt does not feel she belongs here; denies SI or any desire to harm self or others. Chaplain engaged Pt with feelings that others in her life will judge her or that receiving treatment is a failing. Chaplain encouraged view of mental health as being no different from other health needs. Pt believes others (family?) may be able to access her medical chart and use this against her. Chaplain offered prayer at Pt request and support for Pt to lean-in to spiritual resources for strength. Chaplain encouraged staying present and to resist catastrophizing.  Will continue to follow. Christina Hoffman will be on-call Sunday, 12/11.

## 2021-11-03 NOTE — Progress Notes (Signed)
Recreation Therapy Notes  INPATIENT RECREATION TR PLAN  Patient Details Name: Benelli Winther MRN: [id number] DOB: [birth year] Today's Date: 11/03/2021  Rec Therapy Plan Is patient appropriate for Therapeutic Recreation?: Yes Treatment times per week: at least 3 Estimated Length of Stay: 5-7 days TR Treatment/Interventions: Group participation (Comment)  Discharge Criteria Pt will be discharged from therapy if:: Discharged Treatment plan/goals/alternatives discussed and agreed upon by:: Patient/family  Discharge Summary     Dori Devino 11/03/2021, 3:12 PM

## 2021-11-03 NOTE — Progress Notes (Signed)
Recreation Therapy Notes  Date: 11/03/2021  Time: 10:15 am   Location: Craft room    Behavioral response: Appropriate  Intervention Topic: Stress Management    Discussion/Intervention:  Group content on today was focused on stress. The group defined stress and way to cope with stress. Participants expressed how they know when they are stresses out. Individuals described the different ways they have to cope with stress. The group stated reasons why it is important to cope with stress. Patient explained what good stress is and some examples. The group participated in the intervention "Stress Management". Individuals were separated into two group and answered questions related to stress.   Clinical Observations/Feedback: Patient came to group and defined stress management as feeling overwhelmed by a situation. She expressed that she normally manages her stress by meditating and talking to others. Participant explained that managing your stress is good for your health. Individual was social with peers and staff while participating in the intervention.   Christina Hoffman LRT/CTRS         Jaella Weinert 11/03/2021 12:01 PM

## 2021-11-03 NOTE — [facility] Counselor (Signed)
Adult Comprehensive Assessment  Patient ID: Christina Hoffman, female   DOB: October 13, 1971, 50 y.o.   MRN: [id number]  Information Source: Information source: Patient  Current Stressors:  Patient states their primary concerns and needs for treatment are:: "My mother called someone to come talk to me. I wasn't in the mood to talk at that moment so they called [facility] who sent me here." Patient states their goals for this hospitilization and ongoing recovery are:: "To go home." Educational / Learning stressors: None reported Employment / Job issues: Unemployed Family Relationships: Pt feels that no one is willing to help her Financial / Lack of resources (include bankruptcy): Pt currently unemployed Housing / Lack of housing: She is thinking of selling her house because of this current situation Physical health (include injuries & life threatening diseases): Tinnitus, and reports a head injury while at work from approximately a year ago Social relationships: None reported Substance abuse: Pt denies Bereavement / Loss: None reported  Living/Environment/Situation:  Living Arrangements: Alone, Children Living conditions (as described by patient or guardian): She states it is frightening because she hears a man with an "official sounding voice" saying things like she is crazy, she is done, she is fired in the morning between 4am and 10 am Who else lives in the home?: Pt's daughter sometimes stays with her. How long has patient lived in current situation?: 24 years What is atmosphere in current home: Other (Comment) (She expresses that right now it feels frightening.)  Family History:  Marital status: Single Are you sexually active?:  (Unable to assess) What is your sexual orientation?: Unable to assess Has your sexual activity been affected by drugs, alcohol, medication, or emotional stress?: Unable to assess Does patient have children?: Yes How many children?: 1 How is patient's relationship  with their children?: She reports her relationship with her daughter is good.  Childhood History:  By whom was/is the patient raised?: Both parents Additional childhood history information: Pt states her parents lived "in their bubble". She states that they were independent and her time with them was limited. Description of patient's relationship with caregiver when they were a child: "Good" Patient's description of current relationship with people who raised him/her: "Good" How were you disciplined when you got in trouble as a child/adolescent?: "That's a good question, I can't say that I really was disciplined." She then shares that if she tried not to go to school for some reason she was told that she would not be able to go see the horses, which she did almost daily, after school. Does patient have siblings?: Yes Number of Siblings: 1 (older brother) Description of patient's current relationship with siblings: "Not much of a relationship (after he went to the [employer]) but we met and talked recently." Did patient suffer any verbal/emotional/physical/sexual abuse as a child?: No Did patient suffer from severe childhood neglect?: No Has patient ever been sexually abused/assaulted/raped as an adolescent or adult?: No Was the patient ever a victim of a crime or a disaster?: No Witnessed domestic violence?: No Has patient been affected by domestic violence as an adult?: No (Pt is vague regarding trauma/abuse in her life she alludes to having some but does not specify it in any way other than to acknowledge "trauma".)  Education:  Highest grade of school patient has completed: Buyer, retail in [employer] with a minor in philosophy Currently a student?: No Learning disability?: No  Employment/Work Situation:   Employment Situation: Unemployed Patient's Job has Been Impacted by Current Illness:  Yes Describe how Patient's Job has Been Impacted: She reports that she resigned because of the voice  that she hears between 4-10 in the morning What is the Longest Time Patient has Held a Job?: "Four and a half years" Where was the Patient Employed at that Time?: "Worked at a company doing Scientist, forensic, charts, graphs, etc." Has Patient ever Been in the [employer]?: No  Financial Resources:   Financial resources: No income Does patient have a Programmer, applications or guardian?: No  Alcohol/Substance Abuse:   What has been your use of drugs/alcohol within the last 12 months?: Pt denies any substance use. If attempted suicide, did drugs/alcohol play a role in this?:  (N/A) Alcohol/Substance Abuse Treatment Hx: Denies past history If yes, describe treatment: N/A Has alcohol/substance abuse ever caused legal problems?:  (N/A)  Social Support System:   Patient's Community Support System: Good Describe Community Support System: "Parents, friends..." Type of faith/religion: She describes herself as religious How does patient's faith help to cope with current illness?: "Prayer and meditation."  Leisure/Recreation:   Do You Have Hobbies?: Yes Leisure and Hobbies: "watch tv, trying to learn tennis, but I'm not so good at it."  Strengths/Needs:   What is the patient's perception of their strengths?: "Good person, kind." Patient states they can use these personal strengths during their treatment to contribute to their recovery: Unable to assess Patient states these barriers may affect/interfere with their treatment: Pt states that she is scared if she shares everything that is going on she will end up in the hospital again and would rather speak to a lawyer about it. Patient states these barriers may affect their return to the community: None reported Other important information patient would like considered in planning for their treatment: N/A  Discharge Plan:   Currently receiving community mental health services: Yes (From Whom) (pt has an outpatient therapist but did not share his  details.) Patient states concerns and preferences for aftercare planning are: She expresses that she is uncertain if she would like to continue with therapist or anyone at this point. Patient states they will know when they are safe and ready for discharge when: Pt states that she is ready to go home now because this keeps her from solving the mystery of who is talking in the morning, getting a job, and even from moving. Does patient have access to transportation?: Yes Does patient have financial barriers related to discharge medications?: No Patient description of barriers related to discharge medications: N/A Will patient be returning to same living situation after discharge?: Yes  Summary/Recommendations:   Summary and Recommendations (to be completed by the evaluator): Patient is a 50 year old, single, female from [place], [place] ([place] She stated that she is here because her mother called someone to come talk to her and pt did not talk to them, so the person called [facility] and she was sent here. Pt expresses desire to discharge. During the interaction a lot of the conversation was around a voice that she hears in the morning (between 4am and 10am) that says demeaning things directed towards a female. She states that the voice sounds authoritative, like a Engineer, structural or someone in authority. Pt denies hearing this voice anywhere other than her home and states that it does not happen when anyone else is around. She shared that she heard it once and asked her daughter to check around the house without any success. During the discussion regarding trauma, pt was evasive and stated that she  has been through some things off and on but gave no further details. Pt denied any substance use. She is currently unemployed after resigning from her job due to the stress of the mystery of the voice. Pt does have access to transportation. She reports that she is seeing a therapist but when asked about follow  up she stated that she is uncertain if she wants to see anyone because she is afraid of ending up in the hospital again. Pt states that she would rather just share the entire story and plan with a lawyer because they are the only ones who can really help her. Pt has a primary diagnosis of Severe recurrent major depression with psychotic features. Recommendations include crisis stabilization, therapeutic milieu, encourage group attendance and participation, medication management for mood stabilization, and development of comprehensive mental wellness plan.  Shirl Harris. 11/03/2021

## 2021-11-03 NOTE — BH IP Treatment Plan (Signed)
Interdisciplinary Treatment and Diagnostic Plan Update  11/03/2021 Time of Session: 9:30 AM Christina Hoffman MRN: [id number]  Principal Diagnosis: MDD (major depressive disorder)  Secondary Diagnoses: Principal Problem:   MDD (major depressive disorder)   Current Medications:  Current Facility-Administered Medications  Medication Dose Route Frequency Provider Last Rate Last Admin   acetaminophen (TYLENOL) tablet 650 mg  650 mg Oral Q6H PRN Sherlon Handing, NP   650 mg at 11/03/21 0750   alum & mag hydroxide-simeth (MAALOX/MYLANTA) 200-200-20 MG/5ML suspension 30 mL  30 mL Oral Q4H PRN Waldon Merl F, NP       magnesium hydroxide (MILK OF MAGNESIA) suspension 30 mL  30 mL Oral Daily PRN Sherlon Handing, NP       PTA Medications: Medications Prior to Admission  Medication Sig Dispense Refill Last Dose   Phentermine-Topiramate (QSYMIA) 3.75-23 MG CP24 Take 1 capsule by mouth daily.      Vitamin D, Ergocalciferol, (DRISDOL) 1.25 MG (50000 UNIT) CAPS capsule Take 50,000 Units by mouth once a week.       Patient Stressors: Financial difficulties   Health problems    Patient Strengths: Motivation for treatment/growth  Supportive family/friends   Treatment Modalities: Medication Management, Group therapy, Case management,  1 to 1 session with clinician, Psychoeducation, Recreational therapy.   Physician Treatment Plan for Primary Diagnosis: MDD (major depressive disorder) Long Term Goal(s):     Short Term Goals:    Medication Management: Evaluate patient's response, side effects, and tolerance of medication regimen.  Therapeutic Interventions: 1 to 1 sessions, Unit Group sessions and Medication administration.  Evaluation of Outcomes: Not Met  Physician Treatment Plan for Secondary Diagnosis: Principal Problem:   MDD (major depressive disorder)  Long Term Goal(s):     Short Term Goals:       Medication Management: Evaluate patient's response, side effects,  and tolerance of medication regimen.  Therapeutic Interventions: 1 to 1 sessions, Unit Group sessions and Medication administration.  Evaluation of Outcomes: Not Met   RN Treatment Plan for Primary Diagnosis: MDD (major depressive disorder) Long Term Goal(s): Knowledge of disease and therapeutic regimen to maintain health will improve  Short Term Goals: Ability to remain free from injury will improve, Ability to participate in decision making will improve, Ability to verbalize feelings will improve, Ability to identify and develop effective coping behaviors will improve, and Compliance with prescribed medications will improve  Medication Management: RN will administer medications as ordered by provider, will assess and evaluate patient's response and provide education to patient for prescribed medication. RN will report any adverse and/or side effects to prescribing provider.  Therapeutic Interventions: 1 on 1 counseling sessions, Psychoeducation, Medication administration, Evaluate responses to treatment, Monitor vital signs and CBGs as ordered, Perform/monitor CIWA, COWS, AIMS and Fall Risk screenings as ordered, Perform wound care treatments as ordered.  Evaluation of Outcomes: Not Met   LCSW Treatment Plan for Primary Diagnosis: MDD (major depressive disorder) Long Term Goal(s): Safe transition to appropriate next level of care at discharge, Engage patient in therapeutic group addressing interpersonal concerns.  Short Term Goals: Engage patient in aftercare planning with referrals and resources, Facilitate acceptance of mental health diagnosis and concerns, Identify triggers associated with mental health/substance abuse issues, and Increase skills for wellness and recovery  Therapeutic Interventions: Assess for all discharge needs, 1 to 1 time with Social worker, Explore available resources and support systems, Assess for adequacy in community support network, Educate family and  significant other(s) on suicide prevention, Complete  Psychosocial Assessment, Interpersonal group therapy.  Evaluation of Outcomes: Not Met   Progress in Treatment: Attending groups: No. Participating in groups: No. Taking medication as prescribed: Yes. Toleration medication: Yes. Family/Significant other contact made: No, will contact:  when given permission. Patient understands diagnosis: No. Discussing patient identified problems/goals with staff: Yes. Medical problems stabilized or resolved: Yes. Denies suicidal/homicidal ideation: Yes. Issues/concerns per patient self-inventory: No. Other: none.  New problem(s) identified: No, Describe:  none.  New Short Term/Long Term Goal(s): elimination of symptoms of psychosis, medication management for mood stabilization; elimination of SI thoughts; development of comprehensive mental wellness plan.  Patient Goals: "At this point, I want to go home."   Discharge Plan or Barriers: CSW will assist with development of an appropriate aftercare/discharge plan.  Reason for Continuation of Hospitalization: Delusions  Hallucinations Medication stabilization  Estimated Length of Stay: 1-7 days   Scribe for Treatment Team: Shirl Harris, LCSW 11/03/2021 10:06 AM

## 2021-11-03 NOTE — Group Note (Signed)
[facility] LCSW Group Therapy Note   Group Date: 11/03/2021 Start Time: 1300 End Time: 1400  Type of Therapy and Topic:  Group Therapy:  Feelings around Relapse and Recovery  Participation Level:  Did Not Attend   Mood:  Description of Group:    Patients in this group will discuss emotions they experience before and after a relapse. They will process how experiencing these feelings, or avoidance of experiencing them, relates to having a relapse. Facilitator will guide patients to explore emotions they have related to recovery. Patients will be encouraged to process which emotions are more powerful. They will be guided to discuss the emotional reaction significant others in their lives may have to patients' relapse or recovery. Patients will be assisted in exploring ways to respond to the emotions of others without this contributing to a relapse.  Therapeutic Goals: Patient will identify two or more emotions that lead to relapse for them:  Patient will identify two emotions that result when they relapse:  Patient will identify two emotions related to recovery:  Patient will demonstrate ability to communicate their needs through discussion and/or role plays.   Summary of Patient Progress: Patient did not attend group despite encouraged participation.    Therapeutic Modalities:   Cognitive Behavioral Therapy Solution-Focused Therapy Assertiveness Training Relapse Prevention Therapy   Corky Crafts, [place]

## 2021-11-03 NOTE — Progress Notes (Signed)
Recreation Therapy Notes  INPATIENT RECREATION THERAPY ASSESSMENT  Patient Details Name: Christina Hoffman MRN: [id number] DOB: 07/08/1971 Today's Date: 11/03/2021       Information Obtained From: Patient  Able to Participate in Assessment/Interview: Yes  Patient Presentation: Responsive  Reason for Admission (Per Patient): Active Symptoms  Patient Stressors:    Coping Skills:   Read, Loss adjuster, chartered  Leisure Interests (2+):  Exercise - Walking, Individual - Reading, Music - Listen  Frequency of Recreation/Participation: Monthly  Awareness of Community Resources:  Yes  Community Resources:  [place], [place]  Current Use: Yes  If no, Barriers?:    Expressed Interest in [employer] Information: Yes  County of Residence:  [place]  Patient Main Form of Transportation: Set designer  Patient Strengths:  Helpful,caring, thoughtful  Patient Identified Areas of Improvement:  Being shy and nervous  Patient Goal for Hospitalization:  To go home  Current SI (including self-harm):  No  Current HI:  No  Current AVH: No  Staff Intervention Plan: Group Attendance, Collaborate with Interdisciplinary Treatment Team  Consent to Intern Participation: N/A  Marifer Hurd 11/03/2021, 3:12 PM

## 2021-11-03 NOTE — H&P (Signed)
Psychiatric Admission Assessment Adult  Patient Identification: Christina Hoffman MRN:  [id number] Date of Evaluation:  11/03/2021 Chief Complaint:  MDD (major depressive disorder) [F32.9] Principal Diagnosis: Severe recurrent major depression with psychotic features ([place]) Diagnosis:  Principal Problem:   Severe recurrent major depression with psychotic features ([place]) Active Problems:   Tinnitus  History of Present Illness: Patient seen and chart reviewed this is a 50 year old woman sent from [place] under involuntary commitment papers.  Patient describes symptoms of depression with psychotic symptoms going back 1 to 2 months.  Patient is hesitant at first to describe much of her symptoms.  She does talk about how she has been under a lot of stress recently.  She quit her job not long ago.  She mentions several times that she is a single mother.  She has a 70 year old daughter who lives with her half of the time.  Family reports and the patient admits that she has been sitting in her house with the windows blocked out just staring into space.  Sleep has been badly disrupted because of the auditory hallucinations she is having.  Patient describes it as hearing the voice of a man who says horrible things.  It will wake her up often at night but goes on not only in the early morning hours but through the day.  In fact one of the reasons she quit her job was that the voice was still going on when she was on the telephone.  Voices are continuously saying horrible things and she has not seen anybody doing it and nevertheless she maintains a belief that it is someone in another house near her.  I could not persuade her that this was any rational explanation.  Patient denies suicidal ideation.  Denies homicidal ideation.  Denies that she is using any drugs of abuse or drinking.  Sees a therapist but only just started with that.  Not on any medication for depression. Associated Signs/Symptoms: Depression  Symptoms:  depressed mood, anhedonia, insomnia, difficulty concentrating, hopelessness, anxiety, Duration of Depression Symptoms: Greater than two weeks  (Hypo) Manic Symptoms:  Hallucinations, Anxiety Symptoms:  Excessive Worry, Psychotic Symptoms:  Hallucinations: Auditory PTSD Symptoms: No specific trauma reported Total Time spent with patient: 1 hour  Past Psychiatric History: Patient has been treated for depression in the past by her primary care doctor.  Had previously been on modest dose Zoloft and possibly other antidepressants.  Never thought that they were helpful.  Denies any previous episodes of psychosis.  Denies any history of hospitalization.  Denies any past suicide attempts  Is the patient at risk to self? Yes.    Has the patient been a risk to self in the past 6 months? Yes.    Has the patient been a risk to self within the distant past? No.  Is the patient a risk to others? No.  Has the patient been a risk to others in the past 6 months? No.  Has the patient been a risk to others within the distant past? No.   Prior Inpatient Therapy:   Prior Outpatient Therapy:    Alcohol Screening: 1. How often do you have a drink containing alcohol?: Monthly or less 2. How many drinks containing alcohol do you have on a typical day when you are drinking?: 1 or 2 3. How often do you have six or more drinks on one occasion?: Less than monthly AUDIT-C Score: 2 4. How often during the last year have you found that you were  not able to stop drinking once you had started?: Never 5. How often during the last year have you failed to do what was normally expected from you because of drinking?: Never 6. How often during the last year have you needed a first drink in the morning to get yourself going after a heavy drinking session?: Never 7. How often during the last year have you had a feeling of guilt of remorse after drinking?: Never 8. How often during the last year have you been  unable to remember what happened the night before because you had been drinking?: Never 9. Have you or someone else been injured as a result of your drinking?: No 10. Has a relative or friend or a doctor or another health worker been concerned about your drinking or suggested you cut down?: No Alcohol Use Disorder Identification Test Final Score (AUDIT): 2 Substance Abuse History in the last 12 months:  No. Consequences of Substance Abuse: Negative Previous Psychotropic Medications: Yes  Psychological Evaluations: No  Past Medical History:  Past Medical History:  Diagnosis Date   Anxiety    Anxiety    Complication of anesthesia 1991   woke up during procedure   Depression, recurrent (HCC) 03/10/2017   Obesity (BMI 30-39.9) 03/10/2017   Pelvic peritoneal adhesions, female 05/27/2014   Polycystic ovarian syndrome    Seasonal allergies     Past Surgical History:  Procedure Laterality Date   CESAREAN SECTION     CHOLECYSTECTOMY     LAPAROSCOPY N/A 05/27/2014   Procedure: LAPAROSCOPY WITH  LYSIS OF ADHESIONS;  Surgeon: Lavina Hamman, MD;  Location: [facility];  Service: Gynecology;  Laterality: N/A;   Family History:  Family History  Problem Relation Age of Onset   Hyperlipidemia Mother    Family Psychiatric  History: Patient denies knowing of any family history of mental health issues Tobacco Screening:   Social History:  Social History   Substance and Sexual Activity  Alcohol Use No     Social History   Substance and Sexual Activity  Drug Use No    Additional Social History:                           Allergies:   Allergies  Allergen Reactions   Metronidazole Hives   Lab Results:  Results for orders placed or performed during the hospital encounter of 11/01/21 (from the past 48 hour(s))  Comprehensive metabolic panel     Status: Abnormal   Collection Time: 11/01/21  5:36 PM  Result Value Ref Range   Sodium 134 (L) 135 - 145 mmol/L   Potassium 4.0 3.5 -  5.1 mmol/L   Chloride 104 98 - 111 mmol/L   CO2 20 (L) 22 - 32 mmol/L   Glucose, Bld 123 (H) 70 - 99 mg/dL    Comment: Glucose reference range applies only to samples taken after fasting for at least 8 hours.   BUN 19 6 - 20 mg/dL   Creatinine, Ser 8.65 0.44 - 1.00 mg/dL   Calcium 9.2 8.9 - 78.4 mg/dL   Total Protein 8.1 6.5 - 8.1 g/dL   Albumin 4.2 3.5 - 5.0 g/dL   AST 18 15 - 41 U/L   ALT 14 0 - 44 U/L   Alkaline Phosphatase 80 38 - 126 U/L   Total Bilirubin 0.6 0.3 - 1.2 mg/dL   GFR, Estimated >69 >62 mL/min    Comment: (NOTE) Calculated using the CKD-EPI Creatinine Equation (  2021)    Anion gap 10 5 - 15    Comment: Performed at [facility], [address] [place], [place] [address]  Ethanol     Status: None   Collection Time: 11/01/21  5:36 PM  Result Value Ref Range   Alcohol, Ethyl (B) <10 <10 mg/dL    Comment: (NOTE) Lowest detectable limit for serum alcohol is 10 mg/dL.  For medical purposes only. Performed at [facility], [address] [place], [place] [id number]   Salicylate level     Status: Abnormal   Collection Time: 11/01/21  5:36 PM  Result Value Ref Range   Salicylate Lvl [id number] (L) 7.0 - 30.0 mg/dL    Comment: Performed at [facility], [address] [place], [place] [address]  Acetaminophen level     Status: Abnormal   Collection Time: 11/01/21  5:36 PM  Result Value Ref Range   Acetaminophen (Tylenol), Serum <10 (L) 10 - 30 ug/mL    Comment: (NOTE) Therapeutic concentrations vary significantly. A range of 10-30 ug/mL  may be an effective concentration for many patients. However, some  are best treated at concentrations outside of this range. Acetaminophen concentrations >150 ug/mL at 4 hours after ingestion  and >50 ug/mL at 12 hours after ingestion are often associated with  toxic reactions.  Performed at [facility], [address] [place], [place] [address]   cbc     Status: Abnormal   Collection  Time: 11/01/21  5:36 PM  Result Value Ref Range   WBC 16.9 (H) 4.0 - 10.5 K/uL   RBC 4.68 3.87 - 5.11 MIL/uL   Hemoglobin 14.5 12.0 - 15.0 g/dL   HCT 43.5 36.0 - 46.0 %   MCV 92.9 80.0 - 100.0 fL   MCH 31.0 26.0 - 34.0 pg   MCHC 33.3 30.0 - 36.0 g/dL   RDW 13.8 11.5 - 15.5 %   Platelets 355 150 - 400 K/uL   nRBC 0.0 0.0 - 0.2 %    Comment: Performed at [facility], [address] [place], [place] [address]  Urine Drug Screen, Qualitative     Status: None   Collection Time: 11/01/21  5:36 PM  Result Value Ref Range   Tricyclic, Ur Screen NONE DETECTED NONE DETECTED   Amphetamines, Ur Screen NONE DETECTED NONE DETECTED   MDMA (Ecstasy)Ur Screen NONE DETECTED NONE DETECTED   Cocaine Metabolite,Ur [place] NONE DETECTED NONE DETECTED   Opiate, Ur Screen NONE DETECTED NONE DETECTED   Phencyclidine (PCP) Ur S NONE DETECTED NONE DETECTED   Cannabinoid 50 Ng, Ur [place] NONE DETECTED NONE DETECTED   Barbiturates, Ur Screen NONE DETECTED NONE DETECTED   Benzodiazepine, Ur Scrn NONE DETECTED NONE DETECTED   Methadone Scn, Ur NONE DETECTED NONE DETECTED    Comment: (NOTE) Tricyclics + metabolites, urine    Cutoff 1000 ng/mL Amphetamines + metabolites, urine  Cutoff 1000 ng/mL MDMA (Ecstasy), urine              Cutoff 500 ng/mL Cocaine Metabolite, urine          Cutoff 300 ng/mL Opiate + metabolites, urine        Cutoff 300 ng/mL Phencyclidine (PCP), urine         Cutoff 25 ng/mL Cannabinoid, urine                 Cutoff 50 ng/mL Barbiturates + metabolites, urine  Cutoff 200 ng/mL Benzodiazepine, urine  Cutoff 200 ng/mL Methadone, urine                   Cutoff 300 ng/mL  The urine drug screen provides only a preliminary, unconfirmed analytical test result and should not be used for non-medical purposes. Clinical consideration and professional judgment should be applied to any positive drug screen result due to possible interfering substances. A more specific alternate  chemical method must be used in order to obtain a confirmed analytical result. Gas chromatography / mass spectrometry (GC/MS) is the preferred confirm atory method. Performed at [facility], [address] [place], [place] [address]   Pregnancy, urine     Status: None   Collection Time: 11/01/21  5:36 PM  Result Value Ref Range   Preg Test, Ur NEGATIVE NEGATIVE    Comment: Performed at [facility], [address] [place], [place] [address]  Urinalysis, Complete w Microscopic     Status: Abnormal   Collection Time: 11/01/21  5:36 PM  Result Value Ref Range   Color, Urine YELLOW YELLOW   APPearance CLEAR CLEAR   Specific Gravity, Urine <1.005 (L) 1.005 - 1.030   pH 6.0 5.0 - 8.0   Glucose, UA NEGATIVE NEGATIVE mg/dL   Hgb urine dipstick NEGATIVE NEGATIVE   Bilirubin Urine NEGATIVE NEGATIVE   Ketones, ur NEGATIVE NEGATIVE mg/dL   Protein, ur NEGATIVE NEGATIVE mg/dL   Nitrite NEGATIVE NEGATIVE   Leukocytes,Ua NEGATIVE NEGATIVE   Squamous Epithelial / LPF 0-5 0 - 5   WBC, UA 0-5 0 - 5 WBC/hpf   RBC / HPF 0-5 0 - 5 RBC/hpf   Bacteria, UA NONE SEEN NONE SEEN   Mucus PRESENT     Comment: Performed at [facility], [address], [place], [place] [address]  Resp Panel by RT-PCR (Flu A&B, Covid) Nasopharyngeal Swab     Status: None   Collection Time: 11/01/21  9:27 PM   Specimen: Nasopharyngeal Swab; Nasopharyngeal(NP) swabs in vial transport medium  Result Value Ref Range   SARS Coronavirus 2 by RT PCR NEGATIVE NEGATIVE    Comment: (NOTE) SARS-CoV-2 target nucleic acids are NOT DETECTED.  The SARS-CoV-2 RNA is generally detectable in upper respiratory specimens during the acute phase of infection. The lowest concentration of SARS-CoV-2 viral copies this assay can detect is 138 copies/mL. A negative result does not preclude SARS-Cov-2 infection and should not be used as the sole basis for treatment or other patient management decisions. A negative  result may occur with  improper specimen collection/handling, submission of specimen other than nasopharyngeal swab, presence of viral mutation(s) within the areas targeted by this assay, and inadequate number of viral copies(<138 copies/mL). A negative result must be combined with clinical observations, patient history, and epidemiological information. The expected result is Negative.  Fact Sheet for Patients:  EntrepreneurPulse.com.au  Fact Sheet for Healthcare Providers:  IncredibleEmployment.be  This test is no t yet approved or cleared by the [place] FDA and  has been authorized for detection and/or diagnosis of SARS-CoV-2 by FDA under an Emergency Use Authorization (EUA). This EUA will remain  in effect (meaning this test can be used) for the duration of the COVID-19 declaration under Section 564(b)(1) of the Act, 21 U.S.C.section 360bbb-3(b)(1), unless the authorization is terminated  or revoked sooner.       Influenza A by PCR NEGATIVE NEGATIVE   Influenza B by PCR NEGATIVE NEGATIVE    Comment: (NOTE) The Xpert Xpress SARS-CoV-2/FLU/RSV plus assay is intended  as an aid in the diagnosis of influenza from Nasopharyngeal swab specimens and should not be used as a sole basis for treatment. Nasal washings and aspirates are unacceptable for Xpert Xpress SARS-CoV-2/FLU/RSV testing.  Fact Sheet for Patients: EntrepreneurPulse.com.au  Fact Sheet for Healthcare Providers: IncredibleEmployment.be  This test is not yet approved or cleared by the [place] FDA and has been authorized for detection and/or diagnosis of SARS-CoV-2 by FDA under an Emergency Use Authorization (EUA). This EUA will remain in effect (meaning this test can be used) for the duration of the COVID-19 declaration under Section 564(b)(1) of the Act, 21 U.S.C. section 360bbb-3(b)(1), unless the authorization is terminated  or revoked.  Performed at [facility], [address] [place], [place] [address]     Blood Alcohol level:  Lab Results  Component Value Date   [facility] <10 0000000    Metabolic Disorder Labs:  No results found for: HGBA1C, MPG No results found for: PROLACTIN No results found for: CHOL, TRIG, HDL, CHOLHDL, VLDL, LDLCALC  Current Medications: Current Facility-Administered Medications  Medication Dose Route Frequency Provider Last Rate Last Admin   acetaminophen (TYLENOL) tablet 650 mg  650 mg Oral Q6H PRN Sherlon Handing, NP   650 mg at 11/03/21 0750   alum & mag hydroxide-simeth (MAALOX/MYLANTA) 200-200-20 MG/5ML suspension 30 mL  30 mL Oral Q4H PRN Waldon Merl F, NP       magnesium hydroxide (MILK OF MAGNESIA) suspension 30 mL  30 mL Oral Daily PRN Waldon Merl F, NP       risperiDONE (RISPERDAL) tablet 0.5 mg  0.5 mg Oral QHS Woodrow Dulski T, MD       PTA Medications: Medications Prior to Admission  Medication Sig Dispense Refill Last Dose   Phentermine-Topiramate (QSYMIA) 3.75-23 MG CP24 Take 1 capsule by mouth daily.      Vitamin D, Ergocalciferol, (DRISDOL) 1.25 MG (50000 UNIT) CAPS capsule Take 50,000 Units by mouth once a week.       Musculoskeletal: Strength & Muscle Tone: within normal limits Gait & Station: normal Patient leans: N/A            Psychiatric Specialty Exam:  Presentation  General Appearance: Appropriate for Environment  Eye Contact:Good  Speech:Clear and Coherent  Speech Volume:Normal  Handedness:Right   Mood and Affect  Mood:Anxious; Depressed; Hopeless  Affect:Blunt; Congruent; Depressed   Thought Process  Thought Processes:Coherent  Duration of Psychotic Symptoms: No data recorded Past Diagnosis of Schizophrenia or Psychoactive disorder: No  Descriptions of Associations:Intact  Orientation:Full (Time, Place and Person)  Thought Content:Paranoid Ideation; Tangential;  Rumination  Hallucinations:No data recorded Ideas of Reference:Paranoia  Suicidal Thoughts:No data recorded Homicidal Thoughts:No data recorded  Sensorium  Memory:Immediate Good; Recent Good; Remote Good  Judgment:Poor  Insight:Lacking   Executive Functions  Concentration:Fair  Attention Span:Fair  [place]  Language:Good   Psychomotor Activity  Psychomotor Activity:No data recorded  Assets  Assets:Communication Skills; Desire for Improvement; Resilience; Social Support   Sleep  Sleep:No data recorded   Physical Exam: Physical Exam Vitals and nursing note reviewed.  Constitutional:      Appearance: Normal appearance.  HENT:     Head: Normocephalic and atraumatic.     Mouth/Throat:     Pharynx: Oropharynx is clear.  Eyes:     Pupils: Pupils are equal, round, and reactive to light.  Cardiovascular:     Rate and Rhythm: Normal rate and regular rhythm.  Pulmonary:     Effort: Pulmonary effort  is normal.     Breath sounds: Normal breath sounds.  Abdominal:     General: Abdomen is flat.     Palpations: Abdomen is soft.  Musculoskeletal:        General: Normal range of motion.  Skin:    General: Skin is warm and dry.  Neurological:     General: No focal deficit present.     Mental Status: She is alert. Mental status is at baseline.  Psychiatric:        Attention and Perception: She is inattentive. She perceives auditory hallucinations.        Mood and Affect: Mood is anxious and depressed.        Speech: Speech is delayed.        Behavior: Behavior is slowed.        Thought Content: Thought content is paranoid and delusional.        Cognition and Memory: Memory is impaired.        Judgment: Judgment is inappropriate.   Review of Systems  Constitutional: Negative.   HENT: Negative.    Eyes: Negative.   Respiratory: Negative.    Cardiovascular: Negative.   Gastrointestinal: Negative.   Musculoskeletal: Negative.    Skin: Negative.   Neurological: Negative.   Psychiatric/Behavioral:  Positive for depression and hallucinations. Negative for substance abuse and suicidal ideas. The patient is nervous/anxious and has insomnia.   Blood pressure 140/87, pulse 87, temperature 98.2 F (36.8 C), temperature source Oral, resp. rate 17, height 5\' 1"  (1.549 m), weight 97.8 kg, SpO2 97 %. Body mass index is 40.72 kg/m.  Treatment Plan Summary: Daily contact with patient to assess and evaluate symptoms and progress in treatment, Medication management, and Plan patient appears to most likely be having a major depression with psychotic features.  Because of the age had new onset of psychosis a head CT is ordered.  I have also ordered a thyroid screen as well as hemoglobin A1c and lipid panel.  Recommended to the patient that we start low-dose antipsychotic starting with 1/2 mg of Risperdal at night.  She did not absolutely refuse although she continues to insist that she believes the voice is not a psychiatric symptom.  Ongoing daily observation and inclusion in groups.  Reassess dangerousness prior to discharge.  Observation Level/Precautions:  15 minute checks  Laboratory:  HbAIC  Psychotherapy:    Medications:    Consultations:    Discharge Concerns:    Estimated LOS:  Other:     Physician Treatment Plan for Primary Diagnosis: Severe recurrent major depression with psychotic features ([place]) Long Term Goal(s): Improvement in symptoms so as ready for discharge  Short Term Goals: Ability to demonstrate self-control will improve, Ability to identify and develop effective coping behaviors will improve, and Compliance with prescribed medications will improve  Physician Treatment Plan for Secondary Diagnosis: Principal Problem:   Severe recurrent major depression with psychotic features ([place]) Active Problems:   Tinnitus  Long Term Goal(s): Improvement in symptoms so as ready for discharge  Short Term Goals: Ability  to maintain clinical measurements within normal limits will improve  I certify that inpatient services furnished can reasonably be expected to improve the patient's condition.    Alethia Berthold, MD 12/9/20221:19 PM

## 2021-11-03 NOTE — [facility] Suicide Risk Assessment (Signed)
[facility] Admission Suicide Risk Assessment   Nursing information obtained from:  Patient Demographic factors:  Caucasian, Unemployed Current Mental Status:  NA Loss Factors:  Financial problems / change in socioeconomic status Historical Factors:  NA Risk Reduction Factors:  Living with another person, especially a relative, Positive social support  Total Time spent with patient: 1 hour Principal Problem: Severe recurrent major depression with psychotic features (HCC) Diagnosis:  Principal Problem:   Severe recurrent major depression with psychotic features (HCC) Active Problems:   Tinnitus  Subjective Data: Patient seen and chart reviewed.  This is a woman with a history of depression brought to the hospital because of recent onset of auditory hallucinations and paranoia along with symptoms of depression.  No report of suicidal thought or behavior on admission.  Patient denies any suicidal ideation or wish currently.  Does not endorse tormenting of auditory hallucinations.  She denies any past history of suicide attempts and denies substance abuse  Continued Clinical Symptoms:  Alcohol Use Disorder Identification Test Final Score (AUDIT): 2 The "Alcohol Use Disorders Identification Test", Guidelines for Use in Primary Care, Second Edition.  World Science writer [facility]). Score between 0-7:  no or low risk or alcohol related problems. Score between 8-15:  moderate risk of alcohol related problems. Score between 16-19:  high risk of alcohol related problems. Score 20 or above:  warrants further diagnostic evaluation for alcohol dependence and treatment.   CLINICAL FACTORS:   Depression:   Anhedonia Insomnia   Musculoskeletal: Strength & Muscle Tone: within normal limits Gait & Station: normal Patient leans: N/A  Psychiatric Specialty Exam:  Presentation  General Appearance: Appropriate for Environment  Eye Contact:Good  Speech:Clear and Coherent  Speech  Volume:Normal  Handedness:Right   Mood and Affect  Mood:Anxious; Depressed; Hopeless  Affect:Blunt; Congruent; Depressed   Thought Process  Thought Processes:Coherent  Descriptions of Associations:Intact  Orientation:Full (Time, Place and Person)  Thought Content:Paranoid Ideation; Tangential; Rumination  History of Schizophrenia/Schizoaffective disorder:No  Duration of Psychotic Symptoms:No data recorded Hallucinations:No data recorded Ideas of Reference:Paranoia  Suicidal Thoughts:No data recorded Homicidal Thoughts:No data recorded  Sensorium  Memory:Immediate Good; Recent Good; Remote Good  Judgment:Poor  Insight:Lacking   Executive Functions  Concentration:Fair  Attention Span:Fair  Recall:Fair  Fund of Knowledge:Fair  Language:Good   Psychomotor Activity  Psychomotor Activity:No data recorded  Assets  Assets:Communication Skills; Desire for Improvement; Resilience; Social Support   Sleep  Sleep:No data recorded   Physical Exam: Physical Exam Vitals and nursing note reviewed.  Constitutional:      Appearance: Normal appearance.  HENT:     Head: Normocephalic and atraumatic.     Mouth/Throat:     Pharynx: Oropharynx is clear.  Eyes:     Pupils: Pupils are equal, round, and reactive to light.  Cardiovascular:     Rate and Rhythm: Normal rate and regular rhythm.  Pulmonary:     Effort: Pulmonary effort is normal.     Breath sounds: Normal breath sounds.  Abdominal:     General: Abdomen is flat.     Palpations: Abdomen is soft.  Musculoskeletal:        General: Normal range of motion.  Skin:    General: Skin is warm and dry.  Neurological:     General: No focal deficit present.     Mental Status: She is alert. Mental status is at baseline.  Psychiatric:        Attention and Perception: Attention normal. She perceives auditory hallucinations.  Mood and Affect: Mood is anxious and depressed.        Speech: Speech is  delayed.        Behavior: Behavior is slowed.        Thought Content: Thought content is paranoid.        Cognition and Memory: Memory is impaired.        Judgment: Judgment is inappropriate.   Review of Systems  Constitutional: Negative.   HENT: Negative.    Eyes: Negative.   Respiratory: Negative.    Cardiovascular: Negative.   Gastrointestinal: Negative.   Musculoskeletal: Negative.   Skin: Negative.   Neurological: Negative.   Psychiatric/Behavioral:  Positive for depression and hallucinations. Negative for substance abuse and suicidal ideas. The patient is nervous/anxious and has insomnia.   Blood pressure 140/87, pulse 87, temperature 98.2 F (36.8 C), temperature source Oral, resp. rate 17, height 5\' 1"  (1.549 m), weight 97.8 kg, SpO2 97 %. Body mass index is 40.72 kg/m.   COGNITIVE FEATURES THAT CONTRIBUTE TO RISK:  Closed-mindedness    SUICIDE RISK:   Mild:  Suicidal ideation of limited frequency, intensity, duration, and specificity.  There are no identifiable plans, no associated intent, mild dysphoria and related symptoms, good self-control (both objective and subjective assessment), few other risk factors, and identifiable protective factors, including available and accessible social support.  PLAN OF CARE: Continue 15-minute checks.  Engage in individual and group therapy.  Recommend starting low-dose antipsychotic.  Ongoing reexamination of dangerousness prior to discharge  I certify that inpatient services furnished can reasonably be expected to improve the patient's condition.   , MD 11/03/2021, 1:17 PM

## 2021-11-04 MED ORDER — MECLIZINE HCL 25 MG PO TABS
12.5000 mg | ORAL_TABLET | Freq: Two times a day (BID) | ORAL | Status: DC
  Administered 2021-11-04 – 2021-11-07 (×6): 12.5 mg via ORAL
  Filled 2021-11-04 (×8): qty 0.5

## 2021-11-04 MED ORDER — RISPERIDONE 1 MG PO TABS
0.5000 mg | ORAL_TABLET | Freq: Two times a day (BID) | ORAL | Status: DC
  Administered 2021-11-04 – 2021-11-07 (×6): 0.5 mg via ORAL
  Filled 2021-11-04 (×6): qty 1

## 2021-11-04 MED ORDER — FLUOXETINE HCL 20 MG PO CAPS
20.0000 mg | ORAL_CAPSULE | Freq: Every day | ORAL | Status: DC
  Administered 2021-11-04 – 2021-11-07 (×4): 20 mg via ORAL
  Filled 2021-11-04 (×4): qty 1

## 2021-11-04 MED ORDER — HYDROXYZINE HCL 50 MG PO TABS: 50.0000 mg | ORAL_TABLET | Freq: Every evening | ORAL | Status: DC | PRN

## 2021-11-04 NOTE — Progress Notes (Signed)
D: Pt alert and oriented. Pt rates depression 0/10, hopelessness 0/10, and anxiety 0/10. Pt goal: "To have a great day and help others." Pt reports energy level as normal and concentration as being good. Pt reports sleep last night as being good. Pt did not receive medications for sleep. Pt reports experiencing 2/10 back pain at this time, and refusing pain management medication. Pt denies experiencing any SI/HI, or AVH at this time. Pt has c/o tinnitus, reported to MD and orders were placed.   A: Scheduled medications administered to pt, per MD orders. Support and encouragement provided. Frequent verbal contact made. Routine safety checks conducted q15 minutes.   R: No adverse drug reactions noted. Pt verbally contracts for safety at this time. Pt complaint with medications and treatment plan. Pt interacts well with others on the unit. Pt remains safe at this time. Will continue to monitor.

## 2021-11-04 NOTE — Group Note (Signed)
LCSW Group Therapy Note  Group Date: 11/04/2021 Start Time: 1320 End Time: 1420   Type of Therapy and Topic:  Group Therapy - Healthy vs Unhealthy Coping Skills  Participation Level:  Active   Description of Group The focus of this group was to determine what unhealthy coping techniques typically are used by group members and what healthy coping techniques would be helpful in coping with various problems. Patients were guided in becoming aware of the differences between healthy and unhealthy coping techniques. Patients were asked to identify 2-3 healthy coping skills they would like to learn to use more effectively.  Therapeutic Goals Patients learned that coping is what human beings do all day long to deal with various situations in their lives Patients defined and discussed healthy vs unhealthy coping techniques Patients identified their preferred coping techniques and identified whether these were healthy or unhealthy Patients determined 2-3 healthy coping skills they would like to become more familiar with and use more often. Patients provided support and ideas to each other   Summary of Patient Progress:  Patient was present for the entirety of the group session. Patient was an active listener and participated in the topic of discussion, provided helpful advice to others, and added nuance to topic of conversation. Patient identified stress eating as an unhealthy coping skill. Patient identified how she cannot control other people's responses and shared how she has realized the only aspect of an interaction she can control is what her reaction is. Patient identified exercising and helping others as healthy coping skills she plans to use more often.    Therapeutic Modalities Cognitive Behavioral Therapy Motivational Interviewing  Norberto Sorenson, Theresia Majors 11/04/2021  4:29 PM

## 2021-11-04 NOTE — Progress Notes (Signed)
[facility] MD Progress Note  11/04/2021 12:03 PM Christina Hoffman  MRN:  [id number] Subjective: This is my first meeting with Christina Hoffman. she is extremely anxious and perseverates on the ringing in her ears which she states have been going on since she had COVID.  She was supposed to see an ENT this past week but ended up in the hospital.  She does complain of depression and she has a flat to tearful affect.  She has not been on meclizine and I discussed that with her and making some medication changes.  She is able to contract for safety in the hospital.  Principal Problem: Severe recurrent major depression with psychotic features (HCC) Diagnosis: Principal Problem:   Severe recurrent major depression with psychotic features (HCC) Active Problems:   Tinnitus  Total Time spent with patient: 15 minutes  Past Psychiatric History: Unremarkable  Past Medical History:  Past Medical History:  Diagnosis Date   Anxiety    Anxiety    Complication of anesthesia 1991   woke up during procedure   Depression, recurrent (HCC) 03/10/2017   Obesity (BMI 30-39.9) 03/10/2017   Pelvic peritoneal adhesions, female 05/27/2014   Polycystic ovarian syndrome    Seasonal allergies     Past Surgical History:  Procedure Laterality Date   CESAREAN SECTION     CHOLECYSTECTOMY     LAPAROSCOPY N/A 05/27/2014   Procedure: LAPAROSCOPY WITH  LYSIS OF ADHESIONS;  Surgeon: Lavina Hamman, MD;  Location: [facility];  Service: Gynecology;  Laterality: N/A;   Family History:  Family History  Problem Relation Age of Onset   Hyperlipidemia Mother    Family Psychiatric  History: Unremarkable Social History:  Social History   Substance and Sexual Activity  Alcohol Use No     Social History   Substance and Sexual Activity  Drug Use No    Social History   Socioeconomic History   Marital status: Single    Spouse name: Not on file   Number of children: Not on file   Years of education: Not on file   Highest education  level: Not on file  Occupational History    Employer: [employer]  Tobacco Use   Smoking status: Never   Smokeless tobacco: Never  Substance and Sexual Activity   Alcohol use: No   Drug use: No   Sexual activity: Not Currently    Birth control/protection: None  Other Topics Concern   Not on file  Social History Narrative   Not on file   Social Determinants of Health   Financial Resource Strain: Not on file  Food Insecurity: Not on file  Transportation Needs: Not on file  Physical Activity: Not on file  Stress: Not on file  Social Connections: Not on file   Additional Social History:                         Sleep: Fair  Appetite:  Fair  Current Medications: Current Facility-Administered Medications  Medication Dose Route Frequency Provider Last Rate Last Admin   acetaminophen (TYLENOL) tablet 650 mg  650 mg Oral Q6H PRN Gabriel Cirri F, NP   650 mg at 11/03/21 0750   alum & mag hydroxide-simeth (MAALOX/MYLANTA) 200-200-20 MG/5ML suspension 30 mL  30 mL Oral Q4H PRN Gabriel Cirri F, NP       FLUoxetine (PROZAC) capsule 20 mg  20 mg Oral Daily Sarina Ill, DO       hydrOXYzine (ATARAX) tablet 50 mg  50 mg Oral QHS PRN Sarina Ill, DO       magnesium hydroxide (MILK OF MAGNESIA) suspension 30 mL  30 mL Oral Daily PRN Vanetta Mulders, NP       meclizine (ANTIVERT) tablet 12.5 mg  12.5 mg Oral BID Sarina Ill, DO       risperiDONE (RISPERDAL) tablet 0.5 mg  0.5 mg Oral BID AC Sarina Ill, DO        Lab Results:  Results for orders placed or performed during the hospital encounter of 11/02/21 (from the past 48 hour(s))  Lipid panel     Status: Abnormal   Collection Time: 11/03/21  1:10 PM  Result Value Ref Range   Cholesterol 240 (H) 0 - 200 mg/dL   Triglycerides 017 <510 mg/dL   HDL 44 >25 mg/dL   Total CHOL/HDL Ratio 5.5 RATIO   VLDL 25 0 - 40 mg/dL   LDL Cholesterol 852 (H) 0 - 99 mg/dL    Comment:         Total Cholesterol/HDL:CHD Risk Coronary Heart Disease Risk Table                     Men   Women  1/2 Average Risk   3.4   3.3  Average Risk       5.0   4.4  2 X Average Risk   9.6   7.1  3 X Average Risk  23.4   11.0        Use the calculated Patient Ratio above and the CHD Risk Table to determine the patient's CHD Risk.        ATP III CLASSIFICATION (LDL):  <100     mg/dL   Optimal  778-242  mg/dL   Near or Above                    Optimal  130-159  mg/dL   Borderline  353-614  mg/dL   High  >431     mg/dL   Very High Performed at [facility], [address] [place], [place] [address]   TSH     Status: None   Collection Time: 11/03/21  1:10 PM  Result Value Ref Range   TSH 1.433 0.350 - 4.500 uIU/mL    Comment: Performed by a 3rd Generation assay with a functional sensitivity of <=0.01 uIU/mL. Performed at [facility], [address] [place], [place] [address]     Blood Alcohol level:  Lab Results  Component Value Date   [facility] <10 11/01/2021    Metabolic Disorder Labs: No results found for: HGBA1C, MPG No results found for: PROLACTIN Lab Results  Component Value Date   CHOL 240 (H) 11/03/2021   TRIG 123 11/03/2021   HDL 44 11/03/2021   CHOLHDL 5.5 11/03/2021   VLDL 25 11/03/2021   LDLCALC 171 (H) 11/03/2021    Physical Findings: AIMS:  , ,  ,  ,    CIWA:    COWS:     Musculoskeletal: Strength & Muscle Tone: within normal limits Gait & Station: normal Patient leans: N/A  Psychiatric Specialty Exam:  Presentation  General Appearance: Appropriate for Environment  Eye Contact:Good  Speech:Clear and Coherent  Speech Volume:Normal  Handedness:Right   Mood and Affect  Mood:Anxious; Depressed; Hopeless  Affect:Blunt; Congruent; Depressed   Thought Process  Thought Processes:Coherent  Descriptions of Associations:Intact  Orientation:Full (Time, Place and Person)  Thought Content:Paranoid Ideation; Tangential;  Rumination  History of Schizophrenia/Schizoaffective disorder:No  Duration of Psychotic Symptoms:No data recorded Hallucinations:No data recorded Ideas of Reference:Paranoia  Suicidal Thoughts:No data recorded Homicidal Thoughts:No data recorded  Sensorium  Memory:Immediate Good; Recent Good; Remote Good  Judgment:Poor  Insight:Lacking   Executive Functions  Concentration:Fair  Attention Span:Fair  Recall:Fair  Fund of Knowledge:Fair  Language:Good   Psychomotor Activity  Psychomotor Activity:No data recorded  Assets  Assets:Communication Skills; Desire for Improvement; Resilience; Social Support   Sleep  Sleep:No data recorded   Physical Exam: Physical Exam Vitals and nursing note reviewed.  Constitutional:      Appearance: Normal appearance. She is normal weight.  Neurological:     General: No focal deficit present.     Mental Status: She is alert and oriented to person, place, and time.  Psychiatric:        Attention and Perception: Attention and perception normal.        Mood and Affect: Mood is anxious and depressed. Affect is labile.        Speech: Speech normal.        Behavior: Behavior normal. Behavior is cooperative.        Thought Content: Thought content normal.        Cognition and Memory: Cognition normal.        Judgment: Judgment normal.   Review of Systems  Constitutional: Negative.   HENT: Negative.    Eyes: Negative.   Respiratory: Negative.    Cardiovascular: Negative.   Gastrointestinal: Negative.   Genitourinary: Negative.   Musculoskeletal: Negative.   Skin: Negative.   Neurological: Negative.   Endo/Heme/Allergies: Negative.   Psychiatric/Behavioral:  Positive for depression. The patient is nervous/anxious.   Blood pressure 111/70, pulse 85, temperature 98.4 F (36.9 C), temperature source Oral, resp. rate 18, height 5\' 1"  (1.549 m), weight 97.8 kg, SpO2 96 %. Body mass index is 40.72 kg/m.   Treatment Plan  Summary: Daily contact with patient to assess and evaluate symptoms and progress in treatment, Medication management, and Plan start meclizine, increase Risperdal to twice a day, and start Prozac 20 mg daily.  , DO 11/04/2021, 12:03 PM

## 2021-11-04 NOTE — Progress Notes (Signed)
Patient has been pleasant and cooperative. Denies SI HI and AVH. Had a visit with her mom. She said the visit went ok but that her mom sometimes says hurtful things to her but she attributes it to the fact that her mom is in her eighties.

## 2021-11-04 NOTE — Plan of Care (Signed)
  Problem: Education: Goal: Knowledge of [place] [facility] Education information/materials will improve 11/04/2021 0541 by Billy Coast, RN Outcome: Progressing 11/04/2021 0541 by Billy Coast, RN Outcome: Progressing Goal: Emotional status will improve 11/04/2021 0541 by Billy Coast, RN Outcome: Progressing 11/04/2021 0541 by Billy Coast, RN Outcome: Progressing Goal: Mental status will improve 11/04/2021 0541 by Billy Coast, RN Outcome: Progressing 11/04/2021 0541 by Billy Coast, RN Outcome: Progressing Goal: Verbalization of understanding the information provided will improve 11/04/2021 0541 by Billy Coast, RN Outcome: Progressing 11/04/2021 0541 by Billy Coast, RN Outcome: Progressing   Problem: Activity: Goal: Interest or engagement in activities will improve 11/04/2021 0541 by Billy Coast, RN Outcome: Progressing 11/04/2021 0541 by Billy Coast, RN Outcome: Progressing Goal: Sleeping patterns will improve 11/04/2021 0541 by Billy Coast, RN Outcome: Progressing 11/04/2021 0541 by Billy Coast, RN Outcome: Progressing   Problem: Coping: Goal: Ability to verbalize frustrations and anger appropriately will improve 11/04/2021 0541 by Billy Coast, RN Outcome: Progressing 11/04/2021 0541 by Billy Coast, RN Outcome: Progressing Goal: Ability to demonstrate self-control will improve 11/04/2021 0541 by Billy Coast, RN Outcome: Progressing 11/04/2021 0541 by Billy Coast, RN Outcome: Progressing   Problem: Health Behavior/Discharge Planning: Goal: Identification of resources available to assist in meeting health care needs will improve 11/04/2021 0541 by Billy Coast, RN Outcome: Progressing 11/04/2021 0541 by Billy Coast, RN Outcome: Progressing Goal: Compliance with treatment plan for underlying cause of condition will improve 11/04/2021 0541 by  Billy Coast, RN Outcome: Progressing 11/04/2021 0541 by Billy Coast, RN Outcome: Progressing   Problem: Physical Regulation: Goal: Ability to maintain clinical measurements within normal limits will improve 11/04/2021 0541 by Billy Coast, RN Outcome: Progressing 11/04/2021 0541 by Billy Coast, RN Outcome: Progressing   Problem: Safety: Goal: Periods of time without injury will increase 11/04/2021 0541 by Billy Coast, RN Outcome: Progressing 11/04/2021 0541 by Billy Coast, RN Outcome: Progressing

## 2021-11-05 NOTE — Progress Notes (Signed)
[facility] MD Progress Note  11/05/2021 2:45 PM Christina Hoffman  MRN:  [id number] Subjective: And was started on Prozac and Risperdal yesterday.  She still very anxious and tearful.  She wants to go home but I do not think she is ready.  She continues to be fixated on her tinnitus.  She currently denies any suicidal ideation. Principal Problem: Severe recurrent major depression with psychotic features (HCC) Diagnosis: Principal Problem:   Severe recurrent major depression with psychotic features (HCC) Active Problems:   Tinnitus  Total Time spent with patient: 15 minutes  Past Psychiatric History: Unremarkable  Past Medical History:  Past Medical History:  Diagnosis Date   Anxiety    Anxiety    Complication of anesthesia 1991   woke up during procedure   Depression, recurrent (HCC) 03/10/2017   Obesity (BMI 30-39.9) 03/10/2017   Pelvic peritoneal adhesions, female 05/27/2014   Polycystic ovarian syndrome    Seasonal allergies     Past Surgical History:  Procedure Laterality Date   CESAREAN SECTION     CHOLECYSTECTOMY     LAPAROSCOPY N/A 05/27/2014   Procedure: LAPAROSCOPY WITH  LYSIS OF ADHESIONS;  Surgeon: Lavina Hamman, MD;  Location: [facility];  Service: Gynecology;  Laterality: N/A;   Family History:  Family History  Problem Relation Age of Onset   Hyperlipidemia Mother    Family Psychiatric  History: Unremarkable Social History:  Social History   Substance and Sexual Activity  Alcohol Use No     Social History   Substance and Sexual Activity  Drug Use No    Social History   Socioeconomic History   Marital status: Single    Spouse name: Not on file   Number of children: Not on file   Years of education: Not on file   Highest education level: Not on file  Occupational History    Employer: [employer]  Tobacco Use   Smoking status: Never   Smokeless tobacco: Never  Substance and Sexual Activity   Alcohol use: No   Drug use: No   Sexual activity: Not Currently     Birth control/protection: None  Other Topics Concern   Not on file  Social History Narrative   Not on file   Social Determinants of Health   Financial Resource Strain: Not on file  Food Insecurity: Not on file  Transportation Needs: Not on file  Physical Activity: Not on file  Stress: Not on file  Social Connections: Not on file   Additional Social History:                         Sleep: Fair  Appetite:  Fair  Current Medications: Current Facility-Administered Medications  Medication Dose Route Frequency Provider Last Rate Last Admin   acetaminophen (TYLENOL) tablet 650 mg  650 mg Oral Q6H PRN Gabriel Cirri F, NP   650 mg at 11/03/21 0750   alum & mag hydroxide-simeth (MAALOX/MYLANTA) 200-200-20 MG/5ML suspension 30 mL  30 mL Oral Q4H PRN Gabriel Cirri F, NP       FLUoxetine (PROZAC) capsule 20 mg  20 mg Oral Daily Sarina Ill, DO   20 mg at 11/05/21 0803   hydrOXYzine (ATARAX) tablet 50 mg  50 mg Oral QHS PRN Sarina Ill, DO       magnesium hydroxide (MILK OF MAGNESIA) suspension 30 mL  30 mL Oral Daily PRN Vanetta Mulders, NP       meclizine (ANTIVERT) tablet 12.5  mg  12.5 mg Oral BID Sarina Ill, DO   12.5 mg at 11/05/21 4098   risperiDONE (RISPERDAL) tablet 0.5 mg  0.5 mg Oral BID AC Sarina Ill, DO   0.5 mg at 11/05/21 1191    Lab Results: No results found for this or any previous visit (from the past 48 hour(s)).  Blood Alcohol level:  Lab Results  Component Value Date   ETH <10 11/01/2021    Metabolic Disorder Labs: No results found for: HGBA1C, MPG No results found for: PROLACTIN Lab Results  Component Value Date   CHOL 240 (H) 11/03/2021   TRIG 123 11/03/2021   HDL 44 11/03/2021   CHOLHDL 5.5 11/03/2021   VLDL 25 11/03/2021   LDLCALC 171 (H) 11/03/2021    Physical Findings: AIMS:  , ,  ,  ,    CIWA:    COWS:     Musculoskeletal: Strength & Muscle Tone: within normal  limits Gait & Station: normal Patient leans: N/A  Psychiatric Specialty Exam:  Presentation  General Appearance: Appropriate for Environment  Eye Contact:Good  Speech:Clear and Coherent  Speech Volume:Normal  Handedness:Right   Mood and Affect  Mood:Anxious; Depressed; Hopeless  Affect:Blunt; Congruent; Depressed   Thought Process  Thought Processes:Coherent  Descriptions of Associations:Intact  Orientation:Full (Time, Place and Person)  Thought Content:Paranoid Ideation; Tangential; Rumination  History of Schizophrenia/Schizoaffective disorder:No  Duration of Psychotic Symptoms:No data recorded Hallucinations:No data recorded Ideas of Reference:Paranoia  Suicidal Thoughts:No data recorded Homicidal Thoughts:No data recorded  Sensorium  Memory:Immediate Good; Recent Good; Remote Good  Judgment:Poor  Insight:Lacking   Executive Functions  Concentration:Fair  Attention Span:Fair  Recall:Fair  Fund of Knowledge:Fair  Language:Good   Psychomotor Activity  Psychomotor Activity:No data recorded  Assets  Assets:Communication Skills; Desire for Improvement; Resilience; Social Support   Sleep  Sleep:No data recorded   Physical Exam: Physical Exam Vitals and nursing note reviewed.  Constitutional:      Appearance: Normal appearance. She is normal weight.  Neurological:     General: No focal deficit present.     Mental Status: She is alert and oriented to person, place, and time.  Psychiatric:        Attention and Perception: Attention and perception normal.        Mood and Affect: Mood is anxious and depressed. Affect is tearful.        Speech: Speech normal.        Behavior: Behavior is withdrawn. Behavior is cooperative.        Thought Content: Thought content normal.        Cognition and Memory: Cognition and memory normal.        Judgment: Judgment normal.   Review of Systems  Constitutional: Negative.   HENT: Negative.    Eyes:  Negative.   Respiratory: Negative.    Cardiovascular: Negative.   Gastrointestinal: Negative.   Genitourinary: Negative.   Musculoskeletal: Negative.   Skin: Negative.   Neurological: Negative.   Endo/Heme/Allergies: Negative.   Psychiatric/Behavioral:  Positive for depression. The patient is nervous/anxious.   Blood pressure 117/73, pulse 85, temperature 98.3 F (36.8 C), temperature source Oral, resp. rate 18, height 5\' 1"  (1.549 m), weight 97.8 kg, SpO2 95 %. Body mass index is 40.72 kg/m.   Treatment Plan Summary: Daily contact with patient to assess and evaluate symptoms and progress in treatment, Medication management, and Plan continue current medications.  , DO 11/05/2021, 2:45 PM

## 2021-11-05 NOTE — Progress Notes (Signed)
D: Pt alert and oriented. Pt rates depression 0/10, hopelessness 0/10, and anxiety 2/10. Pt goal: "To go home to my family that I so very miss." Pt reports energy level as normal and concentration as being poor. Pt reports sleep last night as being fair. Pt did not receive medications for sleep. Pt reports experiencing 2/10 lower back pain at this time, prn meds offered and refused. Pt denies experiencing any SI/HI, or AH at this time, however endorses hearing echoes in the hallway.  Pt observed interacting with other pts in the dayroom.  A: Scheduled medications administered to pt, per MD orders. Support and encouragement provided. Frequent verbal contact made. Routine safety checks conducted q15 minutes.   R: No adverse drug reactions noted. Pt verbally contracts for safety at this time. Pt complaint with medications. Pt interacts well with others on the unit. Pt remains safe at this time. Will continue to monitor.

## 2021-11-05 NOTE — Progress Notes (Signed)
Pleasant and cooperative. Denies any SI, HI or AVH

## 2021-11-05 NOTE — Group Note (Signed)
LCSW Group Therapy Note  Group Date: 11/05/2021 Start Time: 1315 End Time: 1400   Type of Therapy and Topic:  Group Therapy - How To Cope with Nervousness about Discharge   Participation Level:  Did Not Attend   Description of Group This process group involved identification of patients' feelings about discharge. Some of them are scheduled to be discharged soon, while others are new admissions, but each of them was asked to share thoughts and feelings surrounding discharge from the hospital. One common theme was that they are excited at the prospect of going home, while another was that many of them are apprehensive about sharing why they were hospitalized. Patients were given the opportunity to discuss these feelings with their peers in preparation for discharge.  Therapeutic Goals  Patient will identify their overall feelings about pending discharge. Patient will think about how they might proactively address issues that they believe will once again arise once they get home (i.e. with parents). Patients will participate in discussion about having hope for change.   Summary of Patient Progress: Patient did not attend group despite encouraged participation.    Therapeutic Modalities Cognitive Behavioral Therapy   Marletta Lor 11/05/2021  4:34 PM

## 2021-11-05 NOTE — Plan of Care (Signed)
  Problem: Education: Goal: Knowledge of [facility] Education information/materials will improve Outcome: Progressing Goal: Emotional status will improve Outcome: Progressing Goal: Mental status will improve Outcome: Progressing Goal: Verbalization of understanding the information provided will improve Outcome: Progressing   Problem: Activity: Goal: Interest or engagement in activities will improve Outcome: Progressing Goal: Sleeping patterns will improve Outcome: Progressing   Problem: Coping: Goal: Ability to verbalize frustrations and anger appropriately will improve Outcome: Progressing Goal: Ability to demonstrate self-control will improve Outcome: Progressing   Problem: Health Behavior/Discharge Planning: Goal: Identification of resources available to assist in meeting health care needs will improve Outcome: Progressing Goal: Compliance with treatment plan for underlying cause of condition will improve Outcome: Progressing   Problem: Physical Regulation: Goal: Ability to maintain clinical measurements within normal limits will improve Outcome: Progressing   Problem: Safety: Goal: Periods of time without injury will increase Outcome: Progressing   

## 2021-11-06 LAB — HEMOGLOBIN A1C
Hgb A1c MFr Bld: 6.2 % — ABNORMAL HIGH (ref 4.8–5.6)
Mean Plasma Glucose: 131 mg/dL

## 2021-11-06 NOTE — Progress Notes (Signed)
Recreation Therapy Notes   Date: 11/06/2021  Time: 10:30 am   Location: Craft room    Behavioral response: Appropriate  Intervention Topic: Time Management   Discussion/Intervention:  Group content today was focused on time management. The group defined time management and identified healthy ways to manage time. Individuals expressed how much of the 24 hours they use in a day. Patients expressed how much time they use just for themselves personally. The group expressed how they have managed their time in the past. Individuals participated in the intervention "Managing Life" where they had a chance to see how much of the 24 hours they use and where it goes.  Clinical Observations/Feedback: Patient came to group and defined time management as making the most of your day. Individual was social with peers and staff while participating in the intervention.   Elyssa Pendelton LRT/CTRS         Caryssa Elzey 11/06/2021 11:48 AM

## 2021-11-06 NOTE — Plan of Care (Signed)
  Problem: Education: Goal: Knowledge of [facility] Education information/materials will improve Outcome: Progressing Goal: Emotional status will improve Outcome: Progressing Goal: Mental status will improve Outcome: Progressing Goal: Verbalization of understanding the information provided will improve Outcome: Progressing   Problem: Activity: Goal: Interest or engagement in activities will improve Outcome: Progressing Goal: Sleeping patterns will improve Outcome: Progressing   Problem: Coping: Goal: Ability to verbalize frustrations and anger appropriately will improve Outcome: Progressing Goal: Ability to demonstrate self-control will improve Outcome: Progressing   Problem: Health Behavior/Discharge Planning: Goal: Identification of resources available to assist in meeting health care needs will improve Outcome: Progressing Goal: Compliance with treatment plan for underlying cause of condition will improve Outcome: Progressing   Problem: Physical Regulation: Goal: Ability to maintain clinical measurements within normal limits will improve Outcome: Progressing   Problem: Safety: Goal: Periods of time without injury will increase Outcome: Progressing   

## 2021-11-06 NOTE — Progress Notes (Signed)
[facility] MD Progress Note  11/06/2021 2:31 PM Christina Hoffman  MRN:  [id number] Subjective: Follow-up for 50 year old woman who appears to have severe depression with psychotic features.  Patient reports she is feeling better.  She still focuses on having tinnitus in her ears but denies having actual auditory hallucinations.  Sometimes seems distracted and possibly responding to internal stimuli but not as much as when she came in.  Mood and anxiety are reported to be better.  Patient is having a more reasoned discussion about her future planning.  No complaints about medication Principal Problem: Severe recurrent major depression with psychotic features (HCC) Diagnosis: Principal Problem:   Severe recurrent major depression with psychotic features (HCC) Active Problems:   Tinnitus  Total Time spent with patient: 30 minutes  Past Psychiatric History: Past history of recurrent depression  Past Medical History:  Past Medical History:  Diagnosis Date   Anxiety    Anxiety    Complication of anesthesia 1991   woke up during procedure   Depression, recurrent (HCC) 03/10/2017   Obesity (BMI 30-39.9) 03/10/2017   Pelvic peritoneal adhesions, female 05/27/2014   Polycystic ovarian syndrome    Seasonal allergies     Past Surgical History:  Procedure Laterality Date   CESAREAN SECTION     CHOLECYSTECTOMY     LAPAROSCOPY N/A 05/27/2014   Procedure: LAPAROSCOPY WITH  LYSIS OF ADHESIONS;  Surgeon: Lavina Hamman, MD;  Location: [facility];  Service: Gynecology;  Laterality: N/A;   Family History:  Family History  Problem Relation Age of Onset   Hyperlipidemia Mother    Family Psychiatric  History: See previous Social History:  Social History   Substance and Sexual Activity  Alcohol Use No     Social History   Substance and Sexual Activity  Drug Use No    Social History   Socioeconomic History   Marital status: Single    Spouse name: Not on file   Number of children: Not on file    Years of education: Not on file   Highest education level: Not on file  Occupational History    Employer: [employer]  Tobacco Use   Smoking status: Never   Smokeless tobacco: Never  Substance and Sexual Activity   Alcohol use: No   Drug use: No   Sexual activity: Not Currently    Birth control/protection: None  Other Topics Concern   Not on file  Social History Narrative   Not on file   Social Determinants of Health   Financial Resource Strain: Not on file  Food Insecurity: Not on file  Transportation Needs: Not on file  Physical Activity: Not on file  Stress: Not on file  Social Connections: Not on file   Additional Social History:                         Sleep: Fair  Appetite:  Fair  Current Medications: Current Facility-Administered Medications  Medication Dose Route Frequency Provider Last Rate Last Admin   acetaminophen (TYLENOL) tablet 650 mg  650 mg Oral Q6H PRN Gabriel Cirri F, NP   650 mg at 11/03/21 0750   alum & mag hydroxide-simeth (MAALOX/MYLANTA) 200-200-20 MG/5ML suspension 30 mL  30 mL Oral Q4H PRN Gabriel Cirri F, NP       FLUoxetine (PROZAC) capsule 20 mg  20 mg Oral Daily Sarina Ill, DO   20 mg at 11/06/21 0816   hydrOXYzine (ATARAX) tablet 50 mg  50 mg  Oral QHS PRN Sarina Ill, DO       magnesium hydroxide (MILK OF MAGNESIA) suspension 30 mL  30 mL Oral Daily PRN Gabriel Cirri F, NP       meclizine (ANTIVERT) tablet 12.5 mg  12.5 mg Oral BID Sarina Ill, DO   12.5 mg at 11/06/21 0816   risperiDONE (RISPERDAL) tablet 0.5 mg  0.5 mg Oral BID AC Sarina Ill, DO   0.5 mg at 11/06/21 8768    Lab Results: No results found for this or any previous visit (from the past 48 hour(s)).  Blood Alcohol level:  Lab Results  Component Value Date   ETH <10 11/01/2021    Metabolic Disorder Labs: Lab Results  Component Value Date   HGBA1C 6.2 (H) 11/03/2021   MPG 131 11/03/2021   No results  found for: PROLACTIN Lab Results  Component Value Date   CHOL 240 (H) 11/03/2021   TRIG 123 11/03/2021   HDL 44 11/03/2021   CHOLHDL 5.5 11/03/2021   VLDL 25 11/03/2021   LDLCALC 171 (H) 11/03/2021    Physical Findings: AIMS:  , ,  ,  ,    CIWA:    COWS:     Musculoskeletal: Strength & Muscle Tone: within normal limits Gait & Station: normal Patient leans: N/A  Psychiatric Specialty Exam:  Presentation  General Appearance: Appropriate for Environment  Eye Contact:Good  Speech:Clear and Coherent  Speech Volume:Normal  Handedness:Right   Mood and Affect  Mood:Anxious; Depressed; Hopeless  Affect:Blunt; Congruent; Depressed   Thought Process  Thought Processes:Coherent  Descriptions of Associations:Intact  Orientation:Full (Time, Place and Person)  Thought Content:Paranoid Ideation; Tangential; Rumination  History of Schizophrenia/Schizoaffective disorder:No  Duration of Psychotic Symptoms:No data recorded Hallucinations:No data recorded Ideas of Reference:Paranoia  Suicidal Thoughts:No data recorded Homicidal Thoughts:No data recorded  Sensorium  Memory:Immediate Good; Recent Good; Remote Good  Judgment:Poor  Insight:Lacking   Executive Functions  Concentration:Fair  Attention Span:Fair  Recall:Fair  Fund of Knowledge:Fair  Language:Good   Psychomotor Activity  Psychomotor Activity:No data recorded  Assets  Assets:Communication Skills; Desire for Improvement; Resilience; Social Support   Sleep  Sleep:No data recorded   Physical Exam: Physical Exam Vitals and nursing note reviewed.  Constitutional:      Appearance: Normal appearance.  HENT:     Head: Normocephalic and atraumatic.     Mouth/Throat:     Pharynx: Oropharynx is clear.  Eyes:     Pupils: Pupils are equal, round, and reactive to light.  Cardiovascular:     Rate and Rhythm: Normal rate and regular rhythm.  Pulmonary:     Effort: Pulmonary effort is normal.      Breath sounds: Normal breath sounds.  Abdominal:     General: Abdomen is flat.     Palpations: Abdomen is soft.  Musculoskeletal:        General: Normal range of motion.  Skin:    General: Skin is warm and dry.  Neurological:     General: No focal deficit present.     Mental Status: She is alert. Mental status is at baseline.  Psychiatric:        Attention and Perception: Attention normal.        Mood and Affect: Mood is anxious. Affect is blunt.        Speech: Speech is delayed.        Behavior: Behavior is slowed.        Thought Content: Thought content normal. Thought content does not  include homicidal or suicidal ideation.        Cognition and Memory: Memory is impaired.   Review of Systems  Constitutional: Negative.   HENT:  Positive for tinnitus.   Eyes: Negative.   Respiratory: Negative.    Cardiovascular: Negative.   Gastrointestinal: Negative.   Musculoskeletal: Negative.   Skin: Negative.   Neurological: Negative.   Psychiatric/Behavioral:  Negative for depression, memory loss, substance abuse and suicidal ideas. The patient is nervous/anxious. The patient does not have insomnia.   Blood pressure (!) 142/73, pulse 78, temperature 98.4 F (36.9 C), temperature source Oral, resp. rate 17, height 5\' 1"  (1.549 m), weight 97.8 kg, SpO2 94 %. Body mass index is 40.72 kg/m.   Treatment Plan Summary: Medication management and Plan no change to medication for today.  Patient seems to be doing better and there seems to be less of a concern about acute dangerousness.  Possibly ready for discharge in another day or so.  Supportive counseling and review of medicine.  No acute changes today.  , MD 11/06/2021, 2:31 PM

## 2021-11-06 NOTE — Group Note (Signed)
[facility] LCSW Group Therapy Note    Group Date: 11/06/2021 Start Time: 1300 End Time: 1400  Type of Therapy and Topic:  Group Therapy:  Overcoming Obstacles  Participation Level:  [facility] PARTICIPATION LEVEL: Minimal  Mood:  Description of Group:   In this group patients will be encouraged to explore what they see as obstacles to their own wellness and recovery. They will be guided to discuss their thoughts, feelings, and behaviors related to these obstacles. The group will process together ways to cope with barriers, with attention given to specific choices patients can make. Each patient will be challenged to identify changes they are motivated to make in order to overcome their obstacles. This group will be process-oriented, with patients participating in exploration of their own experiences as well as giving and receiving support and challenge from other group members.  Therapeutic Goals: 1. Patient will identify personal and current obstacles as they relate to admission. 2. Patient will identify barriers that currently interfere with their wellness or overcoming obstacles.  3. Patient will identify feelings, thought process and behaviors related to these barriers. 4. Patient will identify two changes they are willing to make to overcome these obstacles:    Summary of Patient Progress Patient was present in group.  Patient was an active participant. Patient shared that her biggest obstacle has been "finances".  Patient reports that to address this obstacle she can become "my own biggest advocate".   Therapeutic Modalities:   Cognitive Behavioral Therapy Solution Focused Therapy Motivational Interviewing Relapse Prevention Therapy   Harden Mo, LCSW

## 2021-11-06 NOTE — Progress Notes (Signed)
Pt calm and pleasant during assessment. Pt denies SI/H endorses delusions about a man stalking her. Pt given education, support, and encouragement to be active in her treatment plan. Pt didn't have any medications scheduled tonight and hasn't requested anything PRN. Pt being monitored Q 15 minutes for safety per unit protocol. Pt remains safe on the unit.

## 2021-11-06 NOTE — Progress Notes (Signed)
Patient continues to complain of tinnitus and some voices that sound like echoes. Denies SI. Pleasant and cooperative

## 2021-11-07 MED ORDER — FLUOXETINE HCL 20 MG PO CAPS
20.0000 mg | ORAL_CAPSULE | Freq: Every day | ORAL | 1 refills | Status: AC
Start: 2021-11-08 — End: ?

## 2021-11-07 MED ORDER — RISPERIDONE 0.5 MG PO TABS
0.5000 mg | ORAL_TABLET | Freq: Two times a day (BID) | ORAL | 1 refills | Status: AC
Start: 2021-11-07 — End: ?

## 2021-11-07 MED ORDER — HYDROXYZINE HCL 50 MG PO TABS: 50.0000 mg | ORAL_TABLET | Freq: Every evening | ORAL | 1 refills | Status: AC | PRN

## 2021-11-07 NOTE — Discharge Summary (Signed)
Physician Discharge Summary Note  Patient:  Christina Hoffman is an 50 y.o., female MRN:  [id number] DOB:  11-12-1971 Patient phone:  [phone] (home)  Patient address:   [address] [place] [address],  Total Time spent with patient: 45 minutes  Date of Admission:  11/02/2021 Date of Discharge: 11/07/2021  Reason for Admission: Patient was admitted to the psychiatric ward because of auditory hallucinations in the context of depression with poor self-care and worsening symptoms outside the hospital.  Principal Problem: Severe recurrent major depression with psychotic features [facility]) Discharge Diagnoses: Principal Problem:   Severe recurrent major depression with psychotic features [facility]) Active Problems:   Tinnitus   Past Psychiatric History: History of recurrent depression no prior documented history of psychosis however  Past Medical History:  Past Medical History:  Diagnosis Date   Anxiety    Anxiety    Complication of anesthesia 1991   woke up during procedure   Depression, recurrent (HCC) 03/10/2017   Obesity (BMI 30-39.9) 03/10/2017   Pelvic peritoneal adhesions, female 05/27/2014   Polycystic ovarian syndrome    Seasonal allergies     Past Surgical History:  Procedure Laterality Date   CESAREAN SECTION     CHOLECYSTECTOMY     LAPAROSCOPY N/A 05/27/2014   Procedure: LAPAROSCOPY WITH  LYSIS OF ADHESIONS;  Surgeon: Lavina Hamman, MD;  Location: [facility];  Service: Gynecology;  Laterality: N/A;   Family History:  Family History  Problem Relation Age of Onset   Hyperlipidemia Mother    Family Psychiatric  History: See previous Social History:  Social History   Substance and Sexual Activity  Alcohol Use No     Social History   Substance and Sexual Activity  Drug Use No    Social History   Socioeconomic History   Marital status: Single    Spouse name: Not on file   Number of children: Not on file   Years of education: Not on file   Highest  education level: Not on file  Occupational History    Employer: [employer]  Tobacco Use   Smoking status: Never   Smokeless tobacco: Never  Substance and Sexual Activity   Alcohol use: No   Drug use: No   Sexual activity: Not Currently    Birth control/protection: None  Other Topics Concern   Not on file  Social History Narrative   Not on file   Social Determinants of Health   Financial Resource Strain: Not on file  Food Insecurity: Not on file  Transportation Needs: Not on file  Physical Activity: Not on file  Stress: Not on file  Social Connections: Not on file    Hospital Course: Patient admitted to psychiatric unit.  15-minute checks maintained.  Patient did not display any dangerous aggressive or violent behavior in the hospital.  She was cooperative with treatment.  Medication management consisted of adding low-dose Risperdal to antidepressants.  Not continuing any stimulants although the patient says she had not been taking them.  Engage patient in individual and group therapy.  Patient cooperated with that including treatment team.  Consistently denied auditory hallucinations during her hospital stay.  At the time of discharge is much calmer and reports her mood is better.  Denies any auditory hallucinations.  Denies any suicidal thoughts and appears to be capable of appropriate outpatient self-care.  Agrees to follow up with outpatient treatment  Physical Findings: AIMS:  , ,  ,  ,    CIWA:    COWS:  Musculoskeletal: Strength & Muscle Tone: within normal limits Gait & Station: normal Patient leans: N/A   Psychiatric Specialty Exam:  Presentation  General Appearance: Appropriate for Environment  Eye Contact:Good  Speech:Clear and Coherent  Speech Volume:Normal  Handedness:Right   Mood and Affect  Mood:Anxious; Depressed; Hopeless  Affect:Blunt; Congruent; Depressed   Thought Process  Thought Processes:Coherent  Descriptions of  Associations:Intact  Orientation:Full (Time, Place and Person)  Thought Content:Paranoid Ideation; Tangential; Rumination  History of Schizophrenia/Schizoaffective disorder:No  Duration of Psychotic Symptoms:No data recorded Hallucinations:No data recorded Ideas of Reference:Paranoia  Suicidal Thoughts:No data recorded Homicidal Thoughts:No data recorded  Sensorium  Memory:Immediate Good; Recent Good; Remote Good  Judgment:Poor  Insight:Lacking   Executive Functions  Concentration:Fair  Attention Span:Fair  [place]  Language:Good   Psychomotor Activity  Psychomotor Activity:No data recorded  Assets  Assets:Communication Skills; Desire for Improvement; Resilience; Social Support   Sleep  Sleep:No data recorded   Physical Exam: Physical Exam Vitals and nursing note reviewed.  Constitutional:      Appearance: Normal appearance.  HENT:     Head: Normocephalic and atraumatic.     Mouth/Throat:     Pharynx: Oropharynx is clear.  Eyes:     Pupils: Pupils are equal, round, and reactive to light.  Cardiovascular:     Rate and Rhythm: Normal rate and regular rhythm.  Pulmonary:     Effort: Pulmonary effort is normal.     Breath sounds: Normal breath sounds.  Abdominal:     General: Abdomen is flat.     Palpations: Abdomen is soft.  Musculoskeletal:        General: Normal range of motion.  Skin:    General: Skin is warm and dry.  Neurological:     General: No focal deficit present.     Mental Status: She is alert. Mental status is at baseline.  Psychiatric:        Attention and Perception: Attention normal.        Mood and Affect: Mood normal. Affect is blunt.        Speech: Speech normal.        Behavior: Behavior normal.        Thought Content: Thought content normal.        Cognition and Memory: Cognition normal.        Judgment: Judgment normal.   Review of Systems  Constitutional: Negative.   HENT: Negative.     Eyes: Negative.   Respiratory: Negative.    Cardiovascular: Negative.   Gastrointestinal: Negative.   Musculoskeletal: Negative.   Skin: Negative.   Neurological: Negative.   Psychiatric/Behavioral: Negative.    Blood pressure 140/83, pulse 80, temperature 98.8 F (37.1 C), temperature source Oral, resp. rate 17, height 5\' 1"  (1.549 m), weight 97.8 kg, SpO2 95 %. Body mass index is 40.72 kg/m.   Social History   Tobacco Use  Smoking Status Never  Smokeless Tobacco Never   Tobacco Cessation:  A prescription for an FDA-approved tobacco cessation medication provided at discharge   Blood Alcohol level:  Lab Results  Component Value Date   [facility] <10 0000000    Metabolic Disorder Labs:  Lab Results  Component Value Date   HGBA1C 6.2 (H) 11/03/2021   MPG 131 11/03/2021   No results found for: PROLACTIN Lab Results  Component Value Date   CHOL 240 (H) 11/03/2021   TRIG 123 11/03/2021   HDL 44 11/03/2021   CHOLHDL 5.5 11/03/2021   VLDL 25 11/03/2021  [place] 171 (H) 11/03/2021    See Psychiatric Specialty Exam and Suicide Risk Assessment completed by Attending Physician prior to discharge.  Discharge destination:  Home  Is patient on multiple antipsychotic therapies at discharge:  No   Has Patient had three or more failed trials of antipsychotic monotherapy by history:  No  Recommended Plan for Multiple Antipsychotic Therapies: NA  Discharge Instructions     Diet - low sodium heart healthy   Complete by: As directed    Increase activity slowly   Complete by: As directed       Allergies as of 11/07/2021       Reactions   Metronidazole Hives        Medication List     STOP taking these medications    Qsymia 3.75-23 MG Cp24 Generic drug: Phentermine-Topiramate       TAKE these medications      Indication  FLUoxetine 20 MG capsule Commonly known as: PROZAC Take 1 capsule (20 mg total) by mouth daily. Start taking on: November 08, 2021   Indication: Depression   hydrOXYzine 50 MG tablet Commonly known as: ATARAX Take 1 tablet (50 mg total) by mouth at bedtime as needed (Insomnia).  Indication: Feeling Anxious   risperiDONE 0.5 MG tablet Commonly known as: RISPERDAL Take 1 tablet (0.5 mg total) by mouth 2 (two) times daily before a meal.  Indication: Major Depressive Disorder   Vitamin D (Ergocalciferol) 1.25 MG (50000 UNIT) Caps capsule Commonly known as: DRISDOL Take 50,000 Units by mouth once a week.  Indication: Vitamin D Deficiency         Follow-up recommendations: Prescriptions provided to continue current antidepressant and antipsychotic medicine.  Follow up with outpatient mental health treatment as recommended  Comments: Patient agrees to plan  Signed: Alethia Berthold, MD 11/07/2021, 10:36 AM

## 2021-11-07 NOTE — Progress Notes (Addendum)
[facility] LCSW Progress Note   11/07/2021    12:46 PM  Farron Lafond Lerch   [phone]   Type of Contact and Topic:  Transportation Waiver   Patient reports that her family is no longer able to provide transportation and requests hospital to provide transportation home.   Konrad Felix Millikin, patient, has signed [facility] transportation waiver. Waiver was emailed to transportation office.      Signed:  Corky Crafts, MSW, LCSWA, LCAS 11/07/2021 12:46 PM

## 2021-11-07 NOTE — Progress Notes (Signed)
Recreation Therapy Notes   Date: 11/07/2021  Time: 10:15 am   Location:  Craft room    Behavioral response: N/A   Intervention Topic: Self-care      Discussion/Intervention: Patient did not attend group.  Clinical Observations/Feedback:  Patient did not attend group.   Searcy Miyoshi LRT/CTRS        Colan Laymon 11/07/2021 12:21 PM

## 2021-11-07 NOTE — Progress Notes (Signed)
°  [facility] Adult Case Management Discharge Plan :  Will you be returning to the same living situation after discharge:  Yes,  Patient to return to place of residence.  At discharge, do you have transportation home?: Yes,  Patient's family to assist with transportation home.  Do you have the ability to pay for your medications: Yes,  [facility] Medicaid.  Release of information consent forms completed and in the chart;  Patient's signature needed at discharge.  Patient to Follow up at:  Follow-up Information     [facility] Call.   Why: Please call to reschedule an appointment with outpatient counseling service. Contact information: [address] [place] [address] [phone]         [facility] Follow up.   Specialty: Behavioral Health Why: Please present for medication managment outpatient walk in hours, Mon - Wed at 0730, operates on a first come first serve basis and begins see patients at 0800. Contact information: [address] [place] [address] [phone]                Next level of care provider has access to [facility] Link:yes  Safety Planning and Suicide Prevention discussed: Yes,  SPE completed with patient, refused to provide consent for CSW to reach collateral contact.    Has patient been referred to the Quitline?: N/A patient is not a smoker  Patient has been referred for addiction treatment: N/A Patient denies substance use, UDS negative for all.   Corky Crafts, LCSWA 11/07/2021, 11:10 AM

## 2021-11-07 NOTE — Plan of Care (Signed)
°  Problem: Coping Skills Goal: STG - Patient will identify 3 positive coping skills strategies to use post d/c within 5 recreation therapy group sessions Description: STG - Patient will identify 3 positive coping skills strategies to use post d/c within 5 recreation therapy group sessions 11/07/2021 1222 by Alveria Apley, LRT Outcome: Adequate for Discharge 11/07/2021 1222 by Alveria Apley, LRT Outcome: Adequate for Discharge

## 2021-11-07 NOTE — Progress Notes (Signed)
D: Pt alert and oriented. Pt rates depression 0/10, hopelessness 0/10, and anxiety 0/10. Pt goal: "To interact more and to go home since I can now." Pt reports energy level as normal and concentration as being poor. Pt reports sleep last night as being fair. Pt did not receive medications for sleep. Pt reports experiencing 3/10 lower back pain at this time, prn meds offered and refused. Pt denies experiencing any SI/HI, or AVH at this time.   A: Scheduled medications administered to pt, per MD orders. Support and encouragement provided. Frequent verbal contact made. Routine safety checks conducted q15 minutes.   R: No adverse drug reactions noted. Pt verbally contracts for safety at this time. Pt complaint with medications and treatment plan. Pt interacts well with others on the unit. Pt remains safe at this time. Will continue to monitor.

## 2021-11-07 NOTE — Progress Notes (Signed)
D: Pt alert and oriented. Pt denies SI/HI, or AVH at this time. Pt reports she will be able to keep herself safe when she returns home.   A: Pt received discharge and medication education/information. Pt belongings were returned and signed for at this time to include printed prescriptions.   R: Pt verbalized understanding of discharge and medication education/information.  Pt escorted by staff to physician's parking lot where safe transport picked the pt up and transported her home.

## 2021-11-07 NOTE — Progress Notes (Signed)
Recreation Therapy Notes  INPATIENT RECREATION TR PLAN  Patient Details Name: Christina Hoffman MRN: [id number] DOB: [birth year] Today's Date: 11/07/2021  Rec Therapy Plan Is patient appropriate for Therapeutic Recreation?: Yes Treatment times per week: at least 3 Estimated Length of Stay: 5-7 days TR Treatment/Interventions: Group participation (Comment)  Discharge Criteria Pt will be discharged from therapy if:: Discharged Treatment plan/goals/alternatives discussed and agreed upon by:: Patient/family  Discharge Summary Short term goals set: Patient will identify 3 positive coping skills strategies to use post d/c within 5 recreation therapy group sessions Short term goals met: Adequate for discharge Progress toward goals comments: Groups attended Which groups?: Stress management, Other (Comment) (Time Management) Reason goals not met: N/A Therapeutic equipment acquired: N/A Reason patient discharged from therapy: Discharge from hospital Pt/family agrees with progress & goals achieved: Yes Date patient discharged from therapy: 11/07/21   Saban Heinlen 11/07/2021, 12:23 PM

## 2021-11-07 NOTE — [facility] Suicide Risk Assessment (Signed)
[facility] Discharge Suicide Risk Assessment   Principal Problem: Severe recurrent major depression with psychotic features [facility]) Discharge Diagnoses: Principal Problem:   Severe recurrent major depression with psychotic features (HCC) Active Problems:   Tinnitus   Total Time spent with patient: 45 minutes  Musculoskeletal: Strength & Muscle Tone: within normal limits Gait & Station: normal Patient leans: N/A  Psychiatric Specialty Exam  Presentation  General Appearance: Appropriate for Environment  Eye Contact:Good  Speech:Clear and Coherent  Speech Volume:Normal  Handedness:Right   Mood and Affect  Mood:Anxious; Depressed; Hopeless  Duration of Depression Symptoms: Greater than two weeks  Affect:Blunt; Congruent; Depressed   Thought Process  Thought Processes:Coherent  Descriptions of Associations:Intact  Orientation:Full (Time, Place and Person)  Thought Content:Paranoid Ideation; Tangential; Rumination  History of Schizophrenia/Schizoaffective disorder:No  Duration of Psychotic Symptoms:No data recorded Hallucinations:No data recorded Ideas of Reference:Paranoia  Suicidal Thoughts:No data recorded Homicidal Thoughts:No data recorded  Sensorium  Memory:Immediate Good; Recent Good; Remote Good  Judgment:Poor  Insight:Lacking   Executive Functions  Concentration:Fair  Attention Span:Fair  Recall:Fair  Fund of Knowledge:Fair  Language:Good   Psychomotor Activity  Psychomotor Activity:No data recorded  Assets  Assets:Communication Skills; Desire for Improvement; Resilience; Social Support   Sleep  Sleep:No data recorded  Physical Exam: Physical Exam Vitals and nursing note reviewed.  Constitutional:      Appearance: Normal appearance.  HENT:     Head: Normocephalic and atraumatic.     Mouth/Throat:     Pharynx: Oropharynx is clear.  Eyes:     Pupils: Pupils are equal, round, and reactive to light.  Cardiovascular:     Rate and  Rhythm: Normal rate and regular rhythm.  Pulmonary:     Effort: Pulmonary effort is normal.     Breath sounds: Normal breath sounds.  Abdominal:     General: Abdomen is flat.     Palpations: Abdomen is soft.  Musculoskeletal:        General: Normal range of motion.  Skin:    General: Skin is warm and dry.  Neurological:     General: No focal deficit present.     Mental Status: She is alert. Mental status is at baseline.  Psychiatric:        Attention and Perception: Attention normal.        Mood and Affect: Mood normal. Affect is blunt.        Speech: Speech is delayed.        Behavior: Behavior is cooperative.        Thought Content: Thought content normal.        Cognition and Memory: Cognition normal.        Judgment: Judgment normal.   Review of Systems  Constitutional: Negative.   HENT: Negative.    Eyes: Negative.   Respiratory: Negative.    Cardiovascular: Negative.   Gastrointestinal: Negative.   Musculoskeletal: Negative.   Skin: Negative.   Neurological: Negative.   Psychiatric/Behavioral: Negative.    Blood pressure 140/83, pulse 80, temperature 98.8 F (37.1 C), temperature source Oral, resp. rate 17, height 5\' 1"  (1.549 m), weight 97.8 kg, SpO2 95 %. Body mass index is 40.72 kg/m.  Mental Status Per Nursing Assessment::   On Admission:  NA  Demographic Factors:  Caucasian  Loss Factors: Financial problems/change in socioeconomic status  Historical Factors: Impulsivity  Risk Reduction Factors:   Sense of responsibility to family, Living with another person, especially a relative, Positive social support, Positive therapeutic relationship, and Positive coping skills or  problem solving skills  Continued Clinical Symptoms:  Depression:   Delusional  Cognitive Features That Contribute To Risk:  None    Suicide Risk:  Minimal: No identifiable suicidal ideation.  Patients presenting with no risk factors but with morbid ruminations; may be classified  as minimal risk based on the severity of the depressive symptoms    Plan Of Care/Follow-up recommendations:  Patient denies any suicidal ideation and has not displayed dangerous behavior.  She is showing good ability to take care of herself and is agreeable to outpatient treatment.  Has safe place to live.  Minimal evidence of acute risk of dangerousness  Mordecai Rasmussen, MD 11/07/2021, 10:32 AM

## 2022-01-04 ENCOUNTER — Ambulatory Visit (HOSPITAL_COMMUNITY): Payer: Medicaid Other | Admitting: Student in an Organized Health Care Education/Training Program

## 2022-08-10 IMAGING — CT CT HEAD W/O CM
4 series · 17 of 47 positions shown, 19 images · non-contrast
Comparison: None.

CLINICAL DATA: Mental status change

EXAM:
CT HEAD WITHOUT CONTRAST
TECHNIQUE: Contiguous axial images were obtained from the base of the skull
through the vertex without intravenous contrast.

[Series 2: head wo · axial · 0.41mm/px · z∈[-125,-10]mm · 7 of 31 slices shown, 9 images]
[im 4/31  brain]
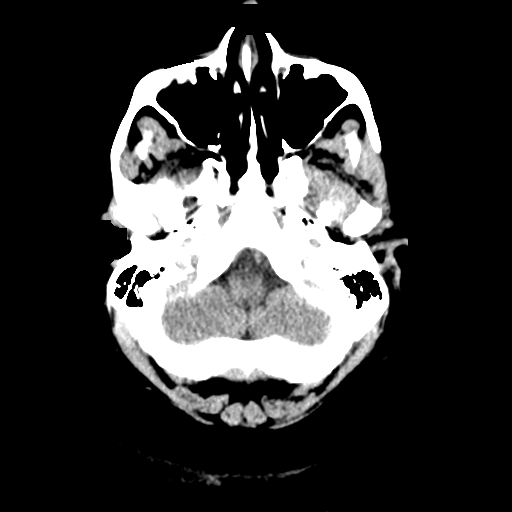
[im 4/31  bone]
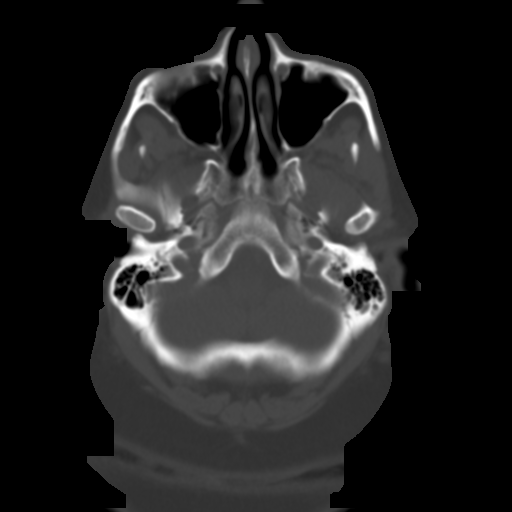
[im 8/31  brain]
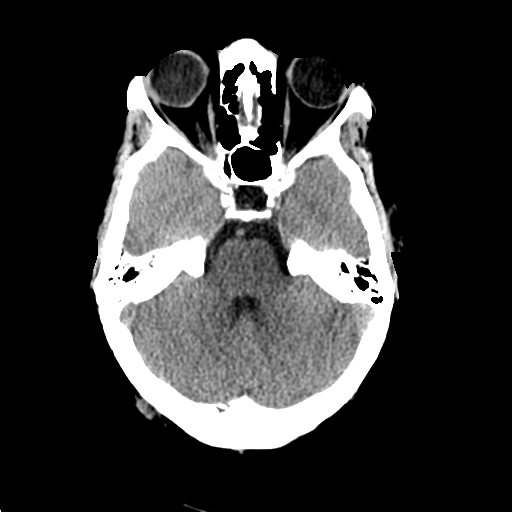
[im 12/31  brain]
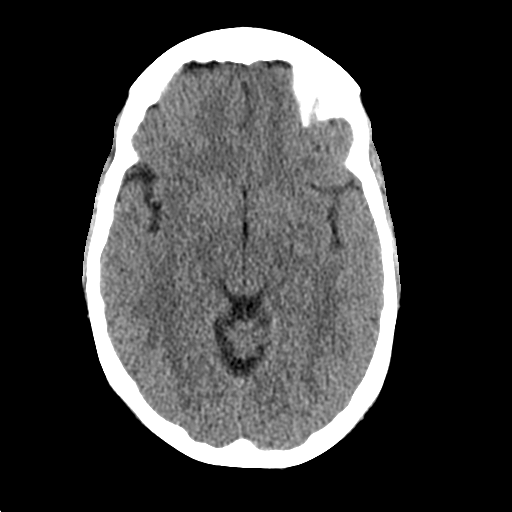
[im 16/31  brain]
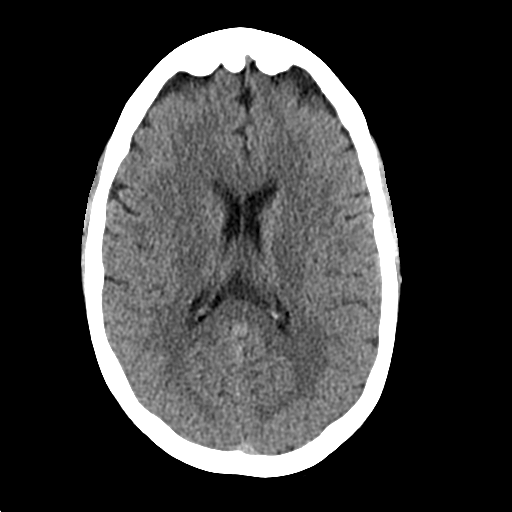
[im 19/31  brain]
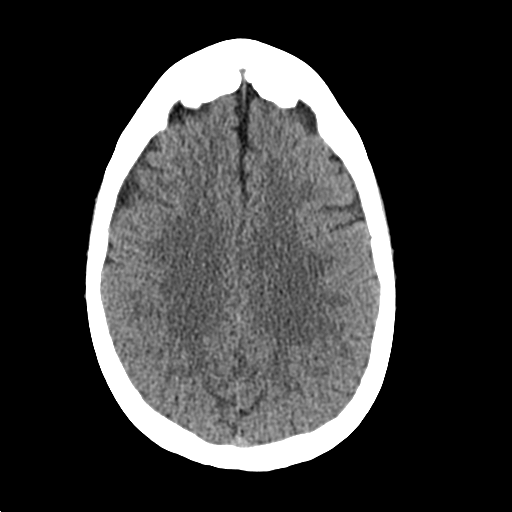
[im 19/31  bone]
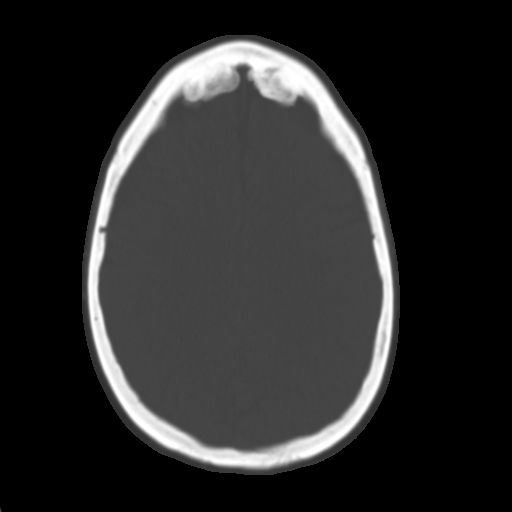
[im 23/31  brain]
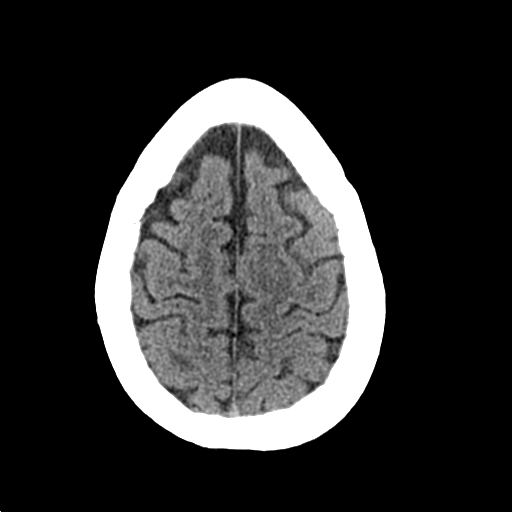
[im 27/31  brain]
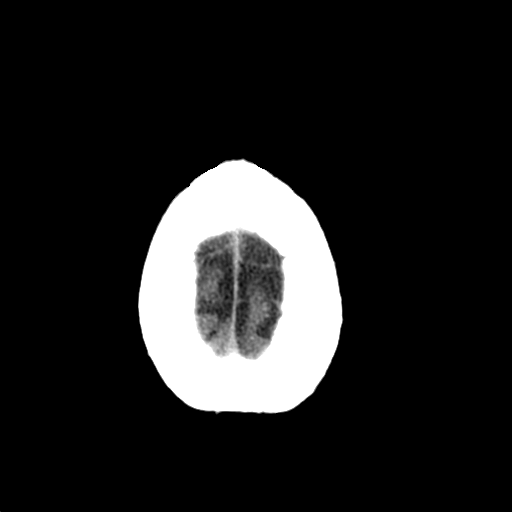

[Series 3: head bone · axial · 0.41mm/px · z∈[-126,-72]mm · 4 of 78 slices shown]
[im 8/78  bone]
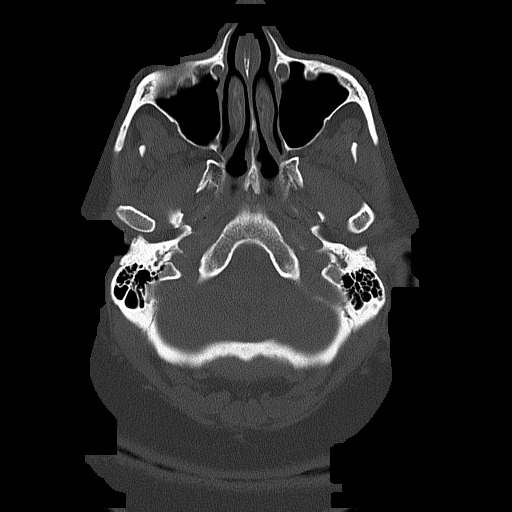
[im 16/78  bone]
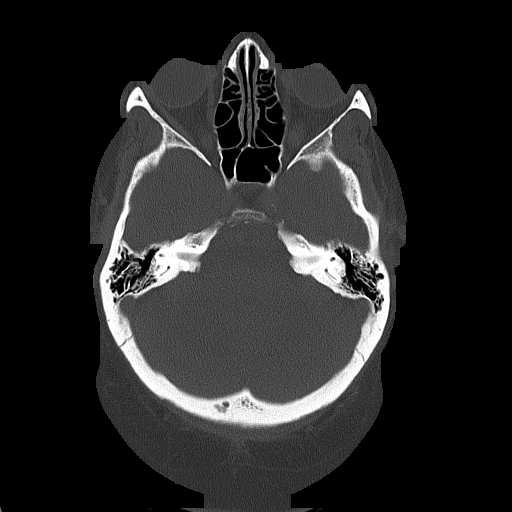
[im 24/78  bone]
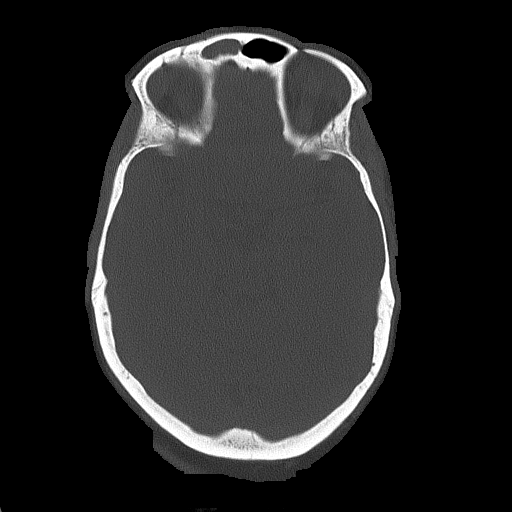
[im 35/78  bone]
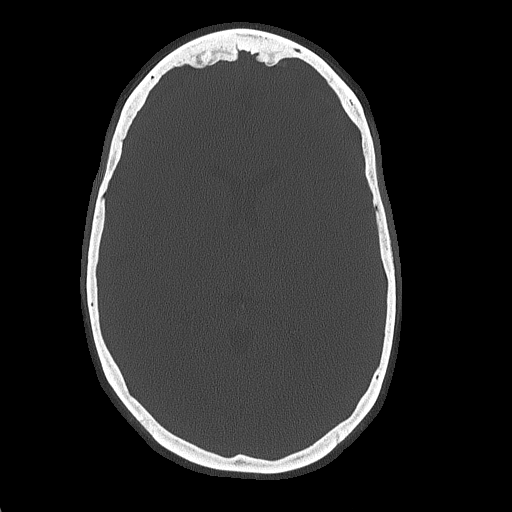

[Series 4: coronal soft tissue · coronal · 0.30mm/px · 3 of 66 slices shown]
[im 22/66  brain]
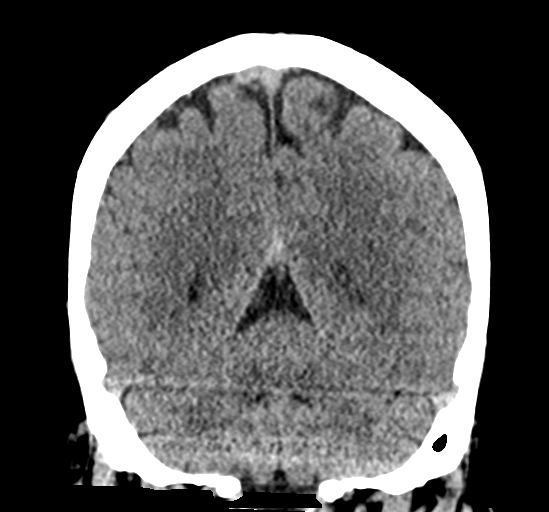
[im 29/66  brain]
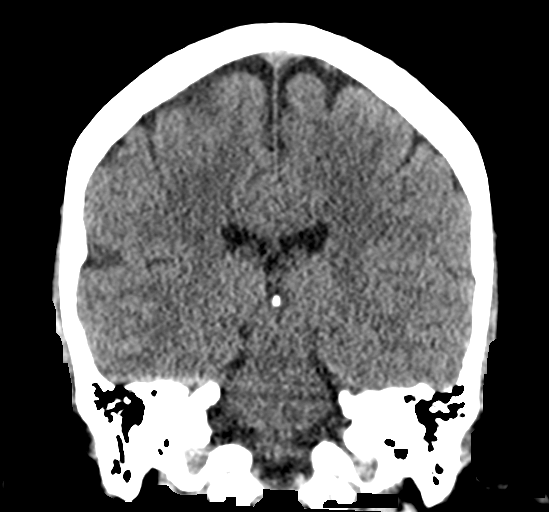
[im 37/66  brain]
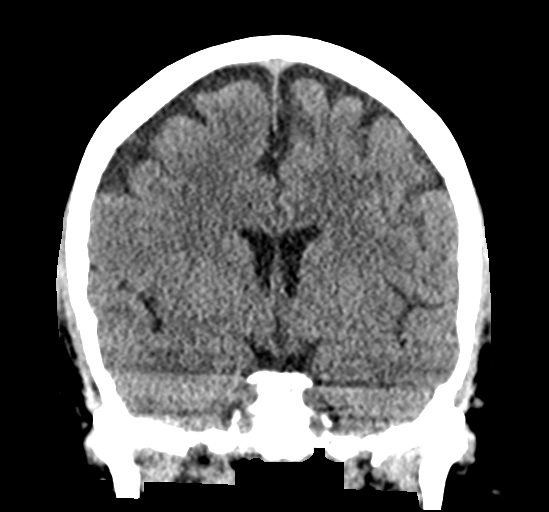

[Series 5: sagittal soft tissue · sagittal · 0.30mm/px · 3 of 51 slices shown]
[im 17/51  brain]
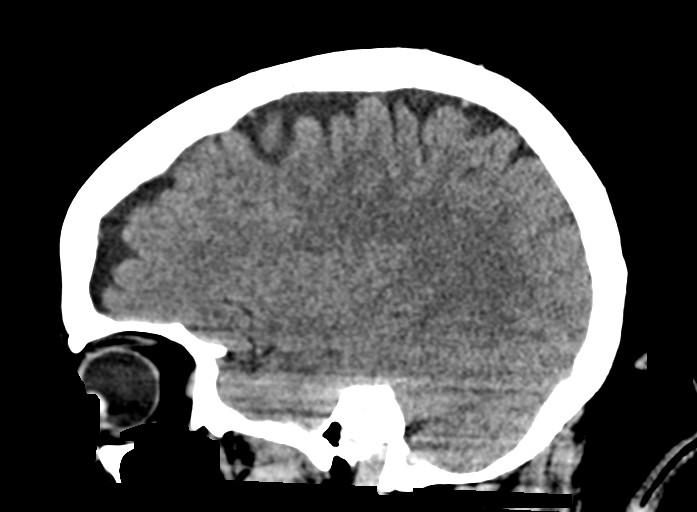
[im 26/51  brain]
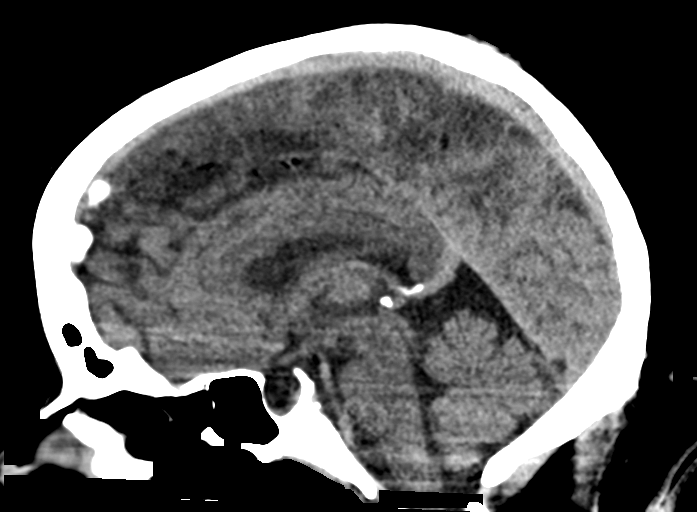
[im 34/51  brain]
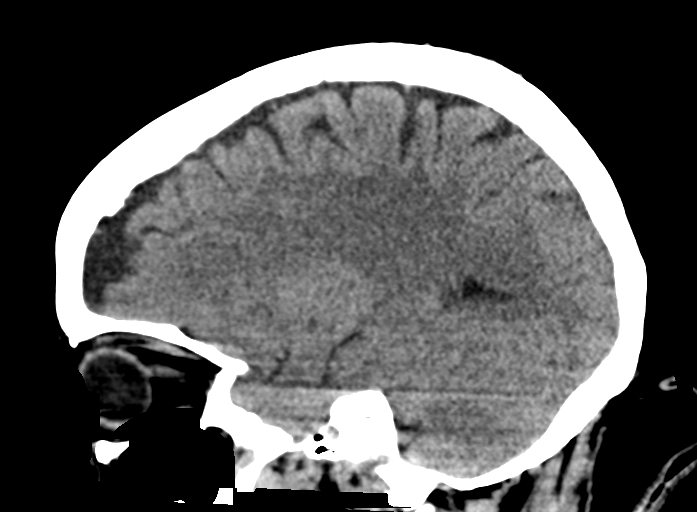

[17 of 47 positions shown; findings below may reference images not displayed]

FINDINGS: Brain: No evidence of acute infarction, hemorrhage, hydrocephalus,
extra-axial collection or mass lesion/mass effect. Empty sella.

Vascular: Negative for hyperdense vessel

Skull: Negative

Sinuses/Orbits: Negative

Other: None
IMPRESSION: Negative CT head
